# Patient Record
Sex: Female | Born: 1960 | Race: White | Hispanic: No | Marital: Single | Smoking: Never smoker
Health system: Southern US, Community
[De-identification: ages and names within clinical notes are randomized; demographics above are authoritative.]

## PROBLEM LIST (undated history)

## (undated) DIAGNOSIS — E785 Hyperlipidemia, unspecified: Secondary | ICD-10-CM

## (undated) DIAGNOSIS — F4312 Post-traumatic stress disorder, chronic: Secondary | ICD-10-CM

## (undated) DIAGNOSIS — R55 Syncope and collapse: Secondary | ICD-10-CM

## (undated) DIAGNOSIS — I10 Essential (primary) hypertension: Secondary | ICD-10-CM

## (undated) DIAGNOSIS — IMO0002 Reserved for concepts with insufficient information to code with codable children: Secondary | ICD-10-CM

## (undated) DIAGNOSIS — G43819 Other migraine, intractable, without status migrainosus: Secondary | ICD-10-CM

## (undated) DIAGNOSIS — M329 Systemic lupus erythematosus, unspecified: Secondary | ICD-10-CM

## (undated) DIAGNOSIS — E119 Type 2 diabetes mellitus without complications: Secondary | ICD-10-CM

## (undated) HISTORY — PX: FOOT SURGERY: SHX648

## (undated) HISTORY — DX: Hyperlipidemia, unspecified: E78.5

## (undated) HISTORY — DX: Other migraine, intractable, without status migrainosus: G43.819

## (undated) HISTORY — PX: STOMACH SURGERY: SHX791

## (undated) HISTORY — PX: ABDOMINAL SURGERY: SHX537

## (undated) HISTORY — DX: Syncope and collapse: R55

## (undated) HISTORY — DX: Post-traumatic stress disorder, chronic: F43.12

## (undated) HISTORY — PX: SPLENECTOMY: SUR1306

---

## 1996-04-23 HISTORY — PX: ORTHOPEDIC SURGERY: SHX850

## 2005-04-23 HISTORY — PX: CERVICAL ABLATION: SHX5771

## 2010-04-23 HISTORY — PX: ROTATOR CUFF REPAIR: SHX139

## 2018-04-24 DIAGNOSIS — D509 Iron deficiency anemia, unspecified: Secondary | ICD-10-CM | POA: Insufficient documentation

## 2018-04-24 HISTORY — DX: Iron deficiency anemia, unspecified: D50.9

## 2018-05-27 DIAGNOSIS — D508 Other iron deficiency anemias: Secondary | ICD-10-CM | POA: Diagnosis not present

## 2018-05-27 DIAGNOSIS — K912 Postsurgical malabsorption, not elsewhere classified: Secondary | ICD-10-CM

## 2018-07-23 DIAGNOSIS — M359 Systemic involvement of connective tissue, unspecified: Secondary | ICD-10-CM

## 2018-07-23 HISTORY — DX: Systemic involvement of connective tissue, unspecified: M35.9

## 2018-08-25 DIAGNOSIS — D508 Other iron deficiency anemias: Secondary | ICD-10-CM | POA: Diagnosis not present

## 2018-08-25 DIAGNOSIS — K912 Postsurgical malabsorption, not elsewhere classified: Secondary | ICD-10-CM

## 2018-09-16 DIAGNOSIS — K912 Postsurgical malabsorption, not elsewhere classified: Secondary | ICD-10-CM

## 2018-09-16 DIAGNOSIS — D508 Other iron deficiency anemias: Secondary | ICD-10-CM

## 2019-01-01 DIAGNOSIS — G5601 Carpal tunnel syndrome, right upper limb: Secondary | ICD-10-CM | POA: Insufficient documentation

## 2019-01-01 DIAGNOSIS — M6530 Trigger finger, unspecified finger: Secondary | ICD-10-CM | POA: Insufficient documentation

## 2019-04-02 DIAGNOSIS — D509 Iron deficiency anemia, unspecified: Secondary | ICD-10-CM

## 2019-04-20 DIAGNOSIS — K279 Peptic ulcer, site unspecified, unspecified as acute or chronic, without hemorrhage or perforation: Secondary | ICD-10-CM

## 2019-04-20 DIAGNOSIS — K2270 Barrett's esophagus without dysplasia: Secondary | ICD-10-CM

## 2019-04-20 HISTORY — DX: Barrett's esophagus without dysplasia: K22.70

## 2019-04-20 HISTORY — DX: Peptic ulcer, site unspecified, unspecified as acute or chronic, without hemorrhage or perforation: K27.9

## 2019-05-08 DIAGNOSIS — M25511 Pain in right shoulder: Secondary | ICD-10-CM | POA: Insufficient documentation

## 2019-08-22 HISTORY — PX: HAND SURGERY: SHX662

## 2019-10-09 DIAGNOSIS — D509 Iron deficiency anemia, unspecified: Secondary | ICD-10-CM

## 2019-11-10 DIAGNOSIS — M7751 Other enthesopathy of right foot: Secondary | ICD-10-CM | POA: Insufficient documentation

## 2019-11-10 DIAGNOSIS — G5761 Lesion of plantar nerve, right lower limb: Secondary | ICD-10-CM

## 2019-11-10 DIAGNOSIS — M2041 Other hammer toe(s) (acquired), right foot: Secondary | ICD-10-CM

## 2019-11-10 HISTORY — DX: Other hammer toe(s) (acquired), right foot: M20.41

## 2019-11-10 HISTORY — DX: Other enthesopathy of right foot and ankle: M77.51

## 2019-11-10 HISTORY — DX: Lesion of plantar nerve, right lower limb: G57.61

## 2019-11-16 ENCOUNTER — Inpatient Hospital Stay (HOSPITAL_COMMUNITY)
Admission: AD | Admit: 2019-11-16 | Discharge: 2019-11-19 | DRG: 885 | Disposition: A | Discharge status: Home / Self Care | Payer: Medicaid Other | Source: Intra-hospital | Attending: Psychiatry | Admitting: Psychiatry

## 2019-11-16 DIAGNOSIS — F332 Major depressive disorder, recurrent severe without psychotic features: Principal | ICD-10-CM | POA: Diagnosis present

## 2019-11-16 DIAGNOSIS — I10 Essential (primary) hypertension: Secondary | ICD-10-CM | POA: Diagnosis present

## 2019-11-16 DIAGNOSIS — M797 Fibromyalgia: Secondary | ICD-10-CM | POA: Diagnosis present

## 2019-11-16 DIAGNOSIS — F4310 Post-traumatic stress disorder, unspecified: Secondary | ICD-10-CM | POA: Diagnosis present

## 2019-11-16 DIAGNOSIS — M329 Systemic lupus erythematosus, unspecified: Secondary | ICD-10-CM | POA: Diagnosis present

## 2019-11-16 DIAGNOSIS — Z79899 Other long term (current) drug therapy: Secondary | ICD-10-CM

## 2019-11-16 DIAGNOSIS — Z91410 Personal history of adult physical and sexual abuse: Secondary | ICD-10-CM

## 2019-11-16 DIAGNOSIS — Z560 Unemployment, unspecified: Secondary | ICD-10-CM | POA: Diagnosis not present

## 2019-11-16 DIAGNOSIS — Z885 Allergy status to narcotic agent status: Secondary | ICD-10-CM

## 2019-11-16 DIAGNOSIS — Z888 Allergy status to other drugs, medicaments and biological substances status: Secondary | ICD-10-CM

## 2019-11-16 DIAGNOSIS — Z882 Allergy status to sulfonamides status: Secondary | ICD-10-CM | POA: Diagnosis not present

## 2019-11-16 DIAGNOSIS — Z880 Allergy status to penicillin: Secondary | ICD-10-CM

## 2019-11-16 DIAGNOSIS — Z87892 Personal history of anaphylaxis: Secondary | ICD-10-CM

## 2019-11-16 DIAGNOSIS — Z915 Personal history of self-harm: Secondary | ICD-10-CM | POA: Diagnosis not present

## 2019-11-16 HISTORY — DX: Essential (primary) hypertension: I10

## 2019-11-16 HISTORY — DX: Systemic lupus erythematosus, unspecified: M32.9

## 2019-11-16 HISTORY — DX: Reserved for concepts with insufficient information to code with codable children: IMO0002

## 2019-11-16 HISTORY — DX: Major depressive disorder, recurrent severe without psychotic features: F33.2

## 2019-11-16 MED ORDER — LOSARTAN POTASSIUM 25 MG PO TABS
25.0000 mg | ORAL_TABLET | Freq: Every day | ORAL | Status: DC
  Administered 2019-11-17 – 2019-11-19 (×3): 25 mg via ORAL
  Filled 2019-11-16 (×5): qty 1

## 2019-11-16 MED ORDER — TOPIRAMATE 100 MG PO TABS
100.0000 mg | ORAL_TABLET | Freq: Every day | ORAL | Status: DC
  Administered 2019-11-17 – 2019-11-19 (×3): 100 mg via ORAL
  Filled 2019-11-16 (×7): qty 1

## 2019-11-16 MED ORDER — PANTOPRAZOLE SODIUM 40 MG PO TBEC
40.0000 mg | DELAYED_RELEASE_TABLET | Freq: Every day | ORAL | Status: DC
  Administered 2019-11-17 – 2019-11-19 (×3): 40 mg via ORAL
  Filled 2019-11-16 (×5): qty 1

## 2019-11-16 MED ORDER — MONTELUKAST SODIUM 10 MG PO TABS
10.0000 mg | ORAL_TABLET | Freq: Every day | ORAL | Status: DC
  Administered 2019-11-17 – 2019-11-18 (×3): 10 mg via ORAL
  Filled 2019-11-16 (×6): qty 1

## 2019-11-16 MED ORDER — FLUTICASONE PROPIONATE 50 MCG/ACT NA SUSP
1.0000 | Freq: Every day | NASAL | Status: DC
  Administered 2019-11-17 – 2019-11-19 (×3): 1 via NASAL
  Filled 2019-11-16: qty 16

## 2019-11-16 MED ORDER — CITALOPRAM HYDROBROMIDE 10 MG PO TABS
10.0000 mg | ORAL_TABLET | Freq: Every day | ORAL | Status: DC
  Administered 2019-11-17 – 2019-11-19 (×3): 10 mg via ORAL
  Filled 2019-11-16 (×5): qty 1

## 2019-11-16 MED ORDER — TRIAMTERENE-HCTZ 37.5-25 MG PO TABS
1.0000 | ORAL_TABLET | Freq: Every day | ORAL | Status: DC
  Administered 2019-11-17 – 2019-11-19 (×3): 1 via ORAL
  Filled 2019-11-16 (×5): qty 1

## 2019-11-16 MED ORDER — SUCRALFATE 1 GM/10ML PO SUSP
1.0000 g | Freq: Three times a day (TID) | ORAL | Status: DC | PRN
  Administered 2019-11-19: 1 g via ORAL
  Filled 2019-11-16: qty 10

## 2019-11-16 MED ORDER — ACETAMINOPHEN 325 MG PO TABS
650.0000 mg | ORAL_TABLET | Freq: Four times a day (QID) | ORAL | Status: DC | PRN
  Administered 2019-11-17 – 2019-11-18 (×2): 650 mg via ORAL
  Filled 2019-11-16 (×2): qty 2

## 2019-11-16 MED ORDER — MAGNESIUM HYDROXIDE 400 MG/5ML PO SUSP: 30.0000 mL | Freq: Every day | ORAL | Status: DC | PRN

## 2019-11-16 MED ORDER — TIZANIDINE HCL 2 MG PO TABS
2.0000 mg | ORAL_TABLET | Freq: Three times a day (TID) | ORAL | Status: DC | PRN
  Administered 2019-11-17 – 2019-11-18 (×3): 2 mg via ORAL
  Filled 2019-11-16 (×3): qty 1

## 2019-11-16 MED ORDER — FOLIC ACID 1 MG PO TABS
1.0000 mg | ORAL_TABLET | Freq: Every day | ORAL | Status: DC
  Administered 2019-11-17 – 2019-11-19 (×3): 1 mg via ORAL
  Filled 2019-11-16 (×5): qty 1

## 2019-11-16 MED ORDER — ALUM & MAG HYDROXIDE-SIMETH 200-200-20 MG/5ML PO SUSP: 30.0000 mL | ORAL | Status: DC | PRN

## 2019-11-16 MED ORDER — HYDROXYCHLOROQUINE SULFATE 200 MG PO TABS
200.0000 mg | ORAL_TABLET | Freq: Two times a day (BID) | ORAL | Status: DC
  Administered 2019-11-17 – 2019-11-19 (×5): 200 mg via ORAL
  Filled 2019-11-16 (×9): qty 1

## 2019-11-17 ENCOUNTER — Other Ambulatory Visit: Payer: Self-pay

## 2019-11-17 ENCOUNTER — Encounter (HOSPITAL_COMMUNITY): Payer: Self-pay | Admitting: Psychiatry

## 2019-11-17 DIAGNOSIS — F332 Major depressive disorder, recurrent severe without psychotic features: Principal | ICD-10-CM

## 2019-11-17 LAB — COMPREHENSIVE METABOLIC PANEL
ALT: 18 U/L (ref 0–44)
AST: 22 U/L (ref 15–41)
Albumin: 3.9 g/dL (ref 3.5–5.0)
Alkaline Phosphatase: 86 U/L (ref 38–126)
Anion gap: 13 (ref 5–15)
BUN: 16 mg/dL (ref 6–20)
CO2: 21 mmol/L — ABNORMAL LOW (ref 22–32)
Calcium: 9.7 mg/dL (ref 8.9–10.3)
Chloride: 108 mmol/L (ref 98–111)
Creatinine, Ser: 0.86 mg/dL (ref 0.44–1.00)
GFR calc Af Amer: >60 mL/min (ref >60)
GFR calc non Af Amer: >60 mL/min (ref >60)
Glucose, Bld: 96 mg/dL (ref 70–99)
Potassium: 4.3 mmol/L (ref 3.5–5.1)
Sodium: 142 mmol/L (ref 135–145)
Total Bilirubin: 0.7 mg/dL (ref 0.3–1.2)
Total Protein: 6.7 g/dL (ref 6.5–8.1)

## 2019-11-17 LAB — LIPID PANEL
Cholesterol: 223 mg/dL — ABNORMAL HIGH (ref 0–200)
HDL: 89 mg/dL (ref >40)
LDL Cholesterol: 104 mg/dL — ABNORMAL HIGH (ref 0–99)
Total CHOL/HDL Ratio: 2.5 RATIO
Triglycerides: 148 mg/dL (ref <150)
VLDL: 30 mg/dL (ref 0–40)

## 2019-11-17 LAB — CBC
HCT: 43.4 % (ref 36.0–46.0)
Hemoglobin: 14 g/dL (ref 12.0–15.0)
MCH: 28.9 pg (ref 26.0–34.0)
MCHC: 32.3 g/dL (ref 30.0–36.0)
MCV: 89.7 fL (ref 80.0–100.0)
Platelets: 456 10*3/uL — ABNORMAL HIGH (ref 150–400)
RBC: 4.84 MIL/uL (ref 3.87–5.11)
RDW: 18.6 % — ABNORMAL HIGH (ref 11.5–15.5)
WBC: 6.6 10*3/uL (ref 4.0–10.5)
nRBC: 0 % (ref 0.0–0.2)

## 2019-11-17 LAB — HEMOGLOBIN A1C
Hgb A1c MFr Bld: 5.9 % — ABNORMAL HIGH (ref 4.8–5.6)
Mean Plasma Glucose: 122.63 mg/dL

## 2019-11-17 LAB — TSH: TSH: 2.632 u[IU]/mL (ref 0.350–4.500)

## 2019-11-17 MED ORDER — LORAZEPAM 1 MG PO TABS: 1.0000 mg | ORAL_TABLET | Freq: Four times a day (QID) | ORAL | Status: DC | PRN

## 2019-11-17 MED ORDER — LOPERAMIDE HCL 2 MG PO CAPS: 2.0000 mg | ORAL_CAPSULE | ORAL | Status: DC | PRN

## 2019-11-17 MED ORDER — ADULT MULTIVITAMIN W/MINERALS CH
1.0000 | ORAL_TABLET | Freq: Every day | ORAL | Status: DC
  Administered 2019-11-17 – 2019-11-19 (×3): 1 via ORAL
  Filled 2019-11-17 (×7): qty 1

## 2019-11-17 MED ORDER — ONDANSETRON 4 MG PO TBDP: 4.0000 mg | ORAL_TABLET | Freq: Four times a day (QID) | ORAL | Status: DC | PRN

## 2019-11-17 MED ORDER — GABAPENTIN 100 MG PO CAPS
100.0000 mg | ORAL_CAPSULE | Freq: Three times a day (TID) | ORAL | Status: DC
  Administered 2019-11-17 – 2019-11-19 (×6): 100 mg via ORAL
  Filled 2019-11-17 (×12): qty 1

## 2019-11-17 MED ORDER — THIAMINE HCL 100 MG PO TABS
100.0000 mg | ORAL_TABLET | Freq: Every day | ORAL | Status: DC
  Administered 2019-11-18 – 2019-11-19 (×2): 100 mg via ORAL
  Filled 2019-11-17 (×4): qty 1

## 2019-11-17 MED ORDER — HYDROXYZINE HCL 25 MG PO TABS: 25.0000 mg | ORAL_TABLET | Freq: Four times a day (QID) | ORAL | Status: DC | PRN

## 2019-11-17 NOTE — Progress Notes (Addendum)
D:  Patient's self inventory sheet, patient has fair sleep, no medications given.  Poor appetite, low energy level, poor concentration.  Rated depression and hopeless #8.  Rated anxiety #5.  Denied withdrawals.  Denied SI.  Denied physical problems, then checked lightheaded and headaches, blurred vision.  Physical pain, worst pain #7, hips, knees, feet, shoulders.  Pain medicine helpful.  Goal is discharge.  Does have discharge plans. A:  Medications administered per MD orders.  Emotional support and encouragement given patient. R:  Denied SI and HI, contracts for safety.  Denied A/V hallucinations.  Safety maintained with 15 minute checks.
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
## 2019-11-17 NOTE — Progress Notes (Signed)
Psychoeducational Group Note  Date:  11/17/2019 Time:  2139  Group Topic/Focus:  Wrap-Up Group:   The focus of this group is to help patients review their daily goal of treatment and discuss progress on daily workbooks.  Participation Level: Did Not Attend  Participation Quality:  Not Applicable  Affect:  Not Applicable  Cognitive:  Not Applicable  Insight:  Not Applicable  Engagement in Group: Not Applicable  Additional Comments:  The patient did not attend group this evening.   Hazle Coca S 11/17/2019, 9:39 PM

## 2019-11-17 NOTE — Progress Notes (Signed)
The patient learned today that she does not need to get upset by her current situation and should talk to others. Her goal for tomorrow is to find her next step in preparing for discharge.
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
## 2019-11-17 NOTE — Plan of Care (Signed)
Nurse discussed anxiety, depression and coping skills with patient.  

## 2019-11-17 NOTE — Progress Notes (Signed)
Pt stated she was having HA and pt could only have Tizanidine at the time, pt may think it may be due to a lack of Caffeine.     11/17/19 2300  Psych Admission Type (Psych Patients Only)  Admission Status Voluntary  Psychosocial Assessment  Patient Complaints Depression  Eye Contact Fair  Facial Expression Worried;Sad  Affect Anxious;Sad  Speech Logical/coherent  Interaction Other (Comment) (appropriate)  Motor Activity Slow  Appearance/Hygiene Disheveled  Behavior Characteristics Cooperative  Mood Pleasant;Sad  Thought Process  Coherency WDL  Content WDL  Delusions None reported or observed  Perception WDL  Hallucination None reported or observed  Judgment Poor  Confusion None  Danger to Self  Current suicidal ideation? Denies  Danger to Others  Danger to Others None reported or observed

## 2019-11-17 NOTE — Progress Notes (Signed)
Patient ID: Maria Pittman, female   DOB: 1960-12-19, 59 y.o.   MRN: [id number]  Admission Note:  D:59 yr female who presents VC in no acute distress for the treatment of SI and Depression. Pt appears flat and depressed. Pt was calm and cooperative with admission process. Pt presents with passive SI and contracts for safety upon admission. Pt denies AVH . Pt stated she and her husband moved from [place] 1.5 yrs ago and 2 daughters( early 43's) made her feel like she abandoned them in [place] . Pt stated she felt like she has no family support since they moved to [place]. Pt stated she felt SI for past 2 months. Per paperwork from [facility] pt took unknown amount of gabapentin, Phenergan, and ETOH  In an attempt to kill herself . Pt stated there was no point in living anymore, pt unsure who called EMS. Pt has Hx iron deficiency anemia and Lupus.   A:Skin was assessed Maria Pittman) and found to be clear of any abnormal marks apart from surgical scars Midline abdomen , L-side. PT searched and no contraband found, POC and unit policies explained and understanding verbalized. Consents obtained. Food and fluids offered, and fluids accepted.   R: Pt had no additional questions or concerns.

## 2019-11-17 NOTE — Tx Team (Signed)
Initial Treatment Plan 11/17/2019 1:09 AM Nolon Nations SFS:[id number]    PATIENT STRESSORS: Health problems Marital or family conflict Medication change or noncompliance   PATIENT STRENGTHS: General fund of knowledge Motivation for treatment/growth   PATIENT IDENTIFIED PROBLEMS: Risk for suicide  depression  "I have to get my 2nd COVID vaccine shot, it was due on the 21 st"                 DISCHARGE CRITERIA:  Improved stabilization in mood, thinking, and/or behavior Verbal commitment to aftercare and medication compliance  PRELIMINARY DISCHARGE PLAN: Attend PHP/IOP Outpatient therapy  PATIENT/FAMILY INVOLVEMENT: This treatment plan has been presented to and reviewed with the patient, Maria Pittman.  The patient and family have been given the opportunity to ask questions and make suggestions.  Delos Haring, RN 11/17/2019, 1:09 AM

## 2019-11-18 DIAGNOSIS — F332 Major depressive disorder, recurrent severe without psychotic features: Secondary | ICD-10-CM | POA: Diagnosis not present

## 2019-11-18 NOTE — Plan of Care (Signed)
  Problem: Education: Goal: Emotional status will improve Outcome: Progressing Goal: Mental status will improve Outcome: Progressing   Problem: Coping: Goal: Ability to verbalize frustrations and anger appropriately will improve Outcome: Progressing   

## 2019-11-18 NOTE — Progress Notes (Signed)
Pt stated she was doing better    11/18/19 2300  Psych Admission Type (Psych Patients Only)  Admission Status Voluntary  Psychosocial Assessment  Patient Complaints None  Eye Contact Fair  Facial Expression Worried;Sad  Affect Anxious;Sad  Speech Logical/coherent  Interaction Other (Comment) (appropriate)  Motor Activity Slow  Appearance/Hygiene Disheveled  Behavior Characteristics Cooperative  Mood Anxious  Thought Process  Coherency WDL  Content WDL  Delusions None reported or observed  Perception WDL  Hallucination None reported or observed  Judgment Poor  Confusion None  Danger to Self  Current suicidal ideation? Denies  Danger to Others  Danger to Others None reported or observed
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
## 2019-11-18 NOTE — Progress Notes (Signed)
[place] NOVEL CORONAVIRUS (COVID-19) DAILY CHECK-OFF SYMPTOMS - answer yes or no to each - every day NO YES  Have you had a fever in the past 24 hours?  . Fever (Temp > 37.80C / 100F) X   Have you had any of these symptoms in the past 24 hours? . New Cough .  Sore Throat  .  Shortness of Breath .  Difficulty Breathing .  Unexplained Body Aches   X   Have you had any one of these symptoms in the past 24 hours not related to allergies?   . Runny Nose .  Nasal Congestion .  Sneezing   X   If you have had runny nose, nasal congestion, sneezing in the past 24 hours, has it worsened?  X   EXPOSURES - check yes or no X   Have you traveled outside the state in the past 14 days?  X   Have you been in contact with someone with a confirmed diagnosis of COVID-19 or PUI in the past 14 days without wearing appropriate PPE?  X   Have you been living in the same home as a person with confirmed diagnosis of COVID-19 or a PUI (household contact)?    X   Have you been diagnosed with COVID-19?    X              What to do next: Answered NO to all: Answered YES to anything:   Proceed with unit schedule Follow the [facility] Inpatient Flowsheet.   

## 2019-11-18 NOTE — Progress Notes (Signed)
D. Pt presents as anxious- is friendly upon approach- pt continues to complain of HA this am, 7/10, endorsing low energy and poor concentration. Per pt's self inventory, pt rated her depression, hopelessness and anxiety a 1/1/0, respectively. Pt wrote that her goal today was "going home".Pt currently denies SI/HI and AVH  A. Labs and vitals monitored. Scheduled meds administered and prn med given for pain.  Pt supported emotionally and encouraged to express concerns and ask questions.   R. Pt remains safe with 15 minute checks. Will continue POC.

## 2019-11-18 NOTE — Progress Notes (Addendum)
[facility] MD Progress Note  11/18/2019 4:30 PM Maria Pittman  MRN:  [id number] Subjective: patient reports she is feeling " a lot better". Denies suicidal ideations.  Denies medication side effects. Objective : I have discussed case with treatment team and have met with patient.  59 year old married female, presented following an overdose on phenergan, gabapentin and alcohol. The overdose was unplanned, impulsive, and in the context of feeling acutely upset after a phone conversation with her adult daughter, whom she states is critical of her and accuses her of being a bad mother. She also reports she moved from [place] to [place] a few months ago and has a limited social and support network locally. She did endorse depression and neuro-vegetative symptoms of depression but denied having suicidal ideations leading up to admission. Medical history remarkable for Lupus for which she has been on plaquenil for years .  Today feeling better, states her mood is better. Denies suicidal ideations. States she feels her daughter is angry with her for having moved away ( moved from [place] to [place] earlier this year) and " that is really the reason she is giving me a hard time". Denies medication side effects- she was continued on her home medications- Celexa 10 mgrs QDAY , Neurontin 100 mgrs TID, Topamax 100 mgrs QDAY. She was also continued on Plaquenil.  She is not presenting with symptoms of alcohol WDL. No tremors, no diaphoresis, no restlessness or agitation, BP 149/78, pulse 93.  Behavior on unit is calm and in good control.  Principal Problem:  Diagnosis: Active Problems:   Severe recurrent major depression without psychotic features ([place])  Total Time spent with patient: 20 minutes  Past Psychiatric History:  Past Medical History:  Past Medical History:  Diagnosis Date  . Hypertension   . Lupus ([place])    History reviewed. No pertinent surgical history. Family History: History reviewed. No pertinent family  history. Family Psychiatric  History:  Social History:  Social History   Substance and Sexual Activity  Alcohol Use Yes     Social History   Substance and Sexual Activity  Drug Use Never    Social History   Socioeconomic History  . Marital status: Single    Spouse name: Not on file  . Number of children: Not on file  . Years of education: Not on file  . Highest education level: Not on file  Occupational History  . Not on file  Tobacco Use  . Smoking status: Never Smoker  . Smokeless tobacco: Never Used  Vaping Use  . Vaping Use: Never used  Substance and Sexual Activity  . Alcohol use: Yes  . Drug use: Never  . Sexual activity: Not Currently  Other Topics Concern  . Not on file  Social History Narrative  . Not on file   Social Determinants of Health   Financial Resource Strain:   . Difficulty of Paying Living Expenses:   Food Insecurity:   . Worried About Charity fundraiser in the Last Year:   . Arboriculturist in the Last Year:   Transportation Needs:   . Film/video editor (Medical):   Marland Kitchen Lack of Transportation (Non-Medical):   Physical Activity:   . Days of Exercise per Week:   . Minutes of Exercise per Session:   Stress:   . Feeling of Stress :   Social Connections:   . Frequency of Communication with Friends and Family:   . Frequency of Social Gatherings with Friends  and Family:   . Attends Religious Services:   . Active Member of Clubs or Organizations:   . Attends Archivist Meetings:   Marland Kitchen Marital Status:    Additional Social History:   Sleep: Good  Appetite:  Good  Current Medications: Current Facility-Administered Medications  Medication Dose Route Frequency Provider Last Rate Last Admin  . acetaminophen (TYLENOL) tablet 650 mg  650 mg Oral Q6H PRN Lindon Romp A, NP   650 mg at 11/18/19 0818  . alum & mag hydroxide-simeth (MAALOX/MYLANTA) 200-200-20 MG/5ML suspension 30 mL  30 mL Oral Q4H PRN Lindon Romp A, NP      .  citalopram (CELEXA) tablet 10 mg  10 mg Oral Daily Lindon Romp A, NP   10 mg at 11/18/19 0814  . fluticasone (FLONASE) 50 MCG/ACT nasal spray 1 spray  1 spray Each Nare Daily Lindon Romp A, NP   1 spray at 11/18/19 0814  . folic acid (FOLVITE) tablet 1 mg  1 mg Oral Daily Lindon Romp A, NP   1 mg at 11/18/19 0814  . gabapentin (NEURONTIN) capsule 100 mg  100 mg Oral TID Merton Wadlow, Myer Peer, MD   100 mg at 11/18/19 1238  . hydroxychloroquine (PLAQUENIL) tablet 200 mg  200 mg Oral BID Lindon Romp A, NP   200 mg at 11/18/19 7116  . hydrOXYzine (ATARAX/VISTARIL) tablet 25 mg  25 mg Oral Q6H PRN Connye Burkitt, NP      . loperamide (IMODIUM) capsule 2-4 mg  2-4 mg Oral PRN Connye Burkitt, NP      . LORazepam (ATIVAN) tablet 1 mg  1 mg Oral Q6H PRN Connye Burkitt, NP      . losartan (COZAAR) tablet 25 mg  25 mg Oral Daily Lindon Romp A, NP   25 mg at 11/18/19 0814  . magnesium hydroxide (MILK OF MAGNESIA) suspension 30 mL  30 mL Oral Daily PRN Lindon Romp A, NP      . montelukast (SINGULAIR) tablet 10 mg  10 mg Oral QHS Lindon Romp A, NP   10 mg at 11/17/19 2053  . multivitamin with minerals tablet 1 tablet  1 tablet Oral Daily Connye Burkitt, NP   1 tablet at 11/18/19 0818  . pantoprazole (PROTONIX) EC tablet 40 mg  40 mg Oral Daily Lindon Romp A, NP   40 mg at 11/18/19 0814  . sucralfate (CARAFATE) 1 GM/10ML suspension 1 g  1 g Oral TID PRN Lindon Romp A, NP      . thiamine tablet 100 mg  100 mg Oral Daily Connye Burkitt, NP   100 mg at 11/18/19 0814  . tiZANidine (ZANAFLEX) tablet 2 mg  2 mg Oral Q8H PRN Lindon Romp A, NP   2 mg at 11/17/19 2053  . topiramate (TOPAMAX) tablet 100 mg  100 mg Oral Daily Lindon Romp A, NP   100 mg at 11/18/19 0814  . triamterene-hydrochlorothiazide (MAXZIDE-25) 37.5-25 MG per tablet 1 tablet  1 tablet Oral Daily Lindon Romp A, NP   1 tablet at 11/18/19 5790    Lab Results:  Results for orders placed or performed during the hospital encounter of 11/16/19  (from the past 48 hour(s))  CBC     Status: Abnormal   Collection Time: 11/17/19  6:22 AM  Result Value Ref Range   WBC 6.6 4.0 - 10.5 K/uL   RBC 4.84 3.87 - 5.11 MIL/uL   Hemoglobin 14.0 12.0 - 15.0 g/dL  HCT 43.4 36 - 46 %   MCV 89.7 80.0 - 100.0 fL   MCH 28.9 26.0 - 34.0 pg   MCHC 32.3 30.0 - 36.0 g/dL   RDW 18.6 (H) 11.5 - 15.5 %   Platelets 456 (H) 150 - 400 K/uL   nRBC 0.0 0.0 - 0.2 %    Comment: Performed at [facility], [place] [address] [place], [place] [address]  Comprehensive metabolic panel     Status: Abnormal   Collection Time: 11/17/19  6:22 AM  Result Value Ref Range   Sodium 142 135 - 145 mmol/L   Potassium 4.3 3.5 - 5.1 mmol/L   Chloride 108 98 - 111 mmol/L   CO2 21 (L) 22 - 32 mmol/L   Glucose, Bld 96 70 - 99 mg/dL    Comment: Glucose reference range applies only to samples taken after fasting for at least 8 hours.   BUN 16 6 - 20 mg/dL   Creatinine, Ser 0.86 0.44 - 1.00 mg/dL   Calcium 9.7 8.9 - 10.3 mg/dL   Total Protein 6.7 6.5 - 8.1 g/dL   Albumin 3.9 3.5 - 5.0 g/dL   AST 22 15 - 41 U/L   ALT 18 0 - 44 U/L   Alkaline Phosphatase 86 38 - 126 U/L   Total Bilirubin 0.7 0.3 - 1.2 mg/dL   GFR calc non Af Amer >60 >60 mL/min   GFR calc Af Amer >60 >60 mL/min   Anion gap 13 5 - 15    Comment: Performed at [facility], [place] [address], [place], [place] [address]  Hemoglobin A1c     Status: Abnormal   Collection Time: 11/17/19  6:22 AM  Result Value Ref Range   Hgb A1c MFr Bld 5.9 (H) 4.8 - 5.6 %    Comment: (NOTE) Pre diabetes:          5.7%-6.4%  Diabetes:              >6.4%  Glycemic control for   <7.0% adults with diabetes    Mean Plasma Glucose 122.63 mg/dL    Comment: Performed at [facility] [address] [place], [place] [address]  Lipid panel     Status: Abnormal   Collection Time: 11/17/19  6:22 AM  Result Value Ref Range   Cholesterol 223 (H) 0 - 200 mg/dL   Triglycerides 148 <150 mg/dL    HDL 89 >40 mg/dL   Total CHOL/HDL Ratio 2.5 RATIO   VLDL 30 0 - 40 mg/dL   LDL Cholesterol 104 (H) 0 - 99 mg/dL    Comment:        Total Cholesterol/HDL:CHD Risk Coronary Heart Disease Risk Table                     Men   Women  1/2 Average Risk   3.4   3.3  Average Risk       5.0   4.4  2 X Average Risk   9.6   7.1  3 X Average Risk  23.4   11.0        Use the calculated Patient Ratio above and the CHD Risk Table to determine the patient's CHD Risk.        ATP III CLASSIFICATION (LDL):  <100     mg/dL   Optimal  100-129  mg/dL   Near or Above  Optimal  130-159  mg/dL   Borderline  160-189  mg/dL   High  >190     mg/dL   Very High Performed at [facility] [address] [place], [place] [address]   TSH     Status: None   Collection Time: 11/17/19  6:22 AM  Result Value Ref Range   TSH 2.632 0.350 - 4.500 uIU/mL    Comment: Performed by a 3rd Generation assay with a functional sensitivity of <=0.01 uIU/mL. Performed at [facility], [place] [address] [place], [place] [address]     Blood Alcohol level:  No results found for: [facility]  Metabolic Disorder Labs: Lab Results  Component Value Date   HGBA1C 5.9 (H) 11/17/2019   MPG 122.63 11/17/2019   No results found for: PROLACTIN Lab Results  Component Value Date   CHOL 223 (H) 11/17/2019   TRIG 148 11/17/2019   HDL 89 11/17/2019   CHOLHDL 2.5 11/17/2019   VLDL 30 11/17/2019   LDLCALC 104 (H) 11/17/2019    Physical Findings: AIMS:  , ,  ,  ,    CIWA:  CIWA-Ar Total: 0 COWS:     Musculoskeletal: Strength & Muscle Tone: within normal limits No tremors, no diaphoresis , no restlessness or psychomotor agitation Gait & Station: normal Patient leans: N/A  Psychiatric Specialty Exam: Physical Exam  Review of Systems reports headache which she reports is likely related to caffeine WDL as she drinks coffee at home, no chest pain, no shortness of breath, no  vomiting   Blood pressure (!) 149/78, pulse 93, temperature 98.1 F (36.7 C), temperature source Oral, resp. rate 18, height 5' (1.524 m), weight 63 kg, SpO2 99 %.Body mass index is 27.15 kg/m.  General Appearance: improved grooming   Eye Contact:  Good  Speech:  Normal Rate  Volume:  Normal  Mood:  improved, states she is feeling better  Affect:  more reactive, fuller in range   Thought Process:  Linear and Descriptions of Associations: Intact  Orientation:  Full (Time, Place, and Person)  Thought Content:  denies hallucinations, no delusions, not internally preoccupied  Suicidal Thoughts:  No denies suicidal or self injurious ideations  Homicidal Thoughts:  No  Memory:  recent and remote grossly intact   Judgement:  Other:  improving   Insight:  fair- improving   Psychomotor Activity:  Normal no restlessness or agitation  Concentration:  Concentration: Good and Attention Span: Good  Recall:  Good  Fund of Knowledge:  Good  Language:  Good  Akathisia:  Negative  Handed:  Right  AIMS (if indicated):     Assets:  Communication Skills Desire for Improvement Resilience  ADL's:  Intact  Cognition:  WNL  Sleep:  Number of Hours: 6.75   Assessment- 59 year old married female, presented following an overdose on phenergan, gabapentin and alcohol. The overdose was unplanned, impulsive, and in the context of feeling acutely upset after a phone conversation with her adult daughter, whom she states is critical of her and accuses her of being a bad mother. She also reports she moved from [place] to [place] a few months ago and has a limited social and support network locally. She did endorse depression and neuro-vegetative symptoms of depression but denied having suicidal ideations leading up to admission. Medical history remarkable for Lupus for which she has been on plaquenil for years .  Patient is presenting with improving mood and range of affect . She denies SI today  and presents future  oriented. Denies medication side effects.    Treatment Plan Summary: Daily contact with patient to assess and evaluate symptoms and progress in treatment, Medication management, Plan inpatient treatment and medications as below Encourage group and milieu participation Treatment team working on disposition planning  Continue Celexa 10 mgrs QDAY for depression Continue Neurontin 100 mgrs TID for anxiety, pain Continue Topamax 100 mgrs QDAY for migraine prophylaxis Continue Ativan 1 mgr Q 6 hours PRN alcohol WDL symptoms if needed  Continue Plaquenil for history of Lupus  Continue Cozaar 25 mgrs QDAY and Triamterene /HCTZ 37.5/25 mg for HTN Continue Protonix and Carafate for GERD , gastric protection Check EKG to monitor QTc ( EKG QTc 473 at [facility] ED on 7/25)     Jenne Campus, MD 11/18/2019, 4:30 PM

## 2019-11-18 NOTE — Progress Notes (Signed)
EKG results placed on the outside of patient's shadow chart  Normal sinus rhythm Normal ECG  QT/QTc  382/464 ms

## 2019-11-18 NOTE — Tx Team (Cosign Needed)
Interdisciplinary Treatment and Diagnostic Plan Update  11/18/2019 Time of Session: 5409W Maria Pittman MRN: [id number]  Principal Diagnosis: <principal problem not specified>  Secondary Diagnoses: Active Problems:   Severe recurrent major depression without psychotic features ([place])   Current Medications:  Current Facility-Administered Medications  Medication Dose Route Frequency Provider Last Rate Last Admin  . acetaminophen (TYLENOL) tablet 650 mg  650 mg Oral Q6H PRN Lindon Romp A, NP   650 mg at 11/18/19 0818  . alum & mag hydroxide-simeth (MAALOX/MYLANTA) 200-200-20 MG/5ML suspension 30 mL  30 mL Oral Q4H PRN Lindon Romp A, NP      . citalopram (CELEXA) tablet 10 mg  10 mg Oral Daily Lindon Romp A, NP   10 mg at 11/18/19 0814  . fluticasone (FLONASE) 50 MCG/ACT nasal spray 1 spray  1 spray Each Nare Daily Lindon Romp A, NP   1 spray at 11/18/19 0814  . folic acid (FOLVITE) tablet 1 mg  1 mg Oral Daily Lindon Romp A, NP   1 mg at 11/18/19 0814  . gabapentin (NEURONTIN) capsule 100 mg  100 mg Oral TID Cobos, Myer Peer, MD   100 mg at 11/18/19 0818  . hydroxychloroquine (PLAQUENIL) tablet 200 mg  200 mg Oral BID Lindon Romp A, NP   200 mg at 11/18/19 5621  . hydrOXYzine (ATARAX/VISTARIL) tablet 25 mg  25 mg Oral Q6H PRN Connye Burkitt, NP      . loperamide (IMODIUM) capsule 2-4 mg  2-4 mg Oral PRN Connye Burkitt, NP      . LORazepam (ATIVAN) tablet 1 mg  1 mg Oral Q6H PRN Connye Burkitt, NP      . losartan (COZAAR) tablet 25 mg  25 mg Oral Daily Lindon Romp A, NP   25 mg at 11/18/19 0814  . magnesium hydroxide (MILK OF MAGNESIA) suspension 30 mL  30 mL Oral Daily PRN Lindon Romp A, NP      . montelukast (SINGULAIR) tablet 10 mg  10 mg Oral QHS Lindon Romp A, NP   10 mg at 11/17/19 2053  . multivitamin with minerals tablet 1 tablet  1 tablet Oral Daily Connye Burkitt, NP   1 tablet at 11/18/19 0818  . pantoprazole (PROTONIX) EC tablet 40 mg  40 mg Oral Daily Lindon Romp A, NP    40 mg at 11/18/19 0814  . sucralfate (CARAFATE) 1 GM/10ML suspension 1 g  1 g Oral TID PRN Lindon Romp A, NP      . thiamine tablet 100 mg  100 mg Oral Daily Connye Burkitt, NP   100 mg at 11/18/19 0814  . tiZANidine (ZANAFLEX) tablet 2 mg  2 mg Oral Q8H PRN Lindon Romp A, NP   2 mg at 11/17/19 2053  . topiramate (TOPAMAX) tablet 100 mg  100 mg Oral Daily Lindon Romp A, NP   100 mg at 11/18/19 0814  . triamterene-hydrochlorothiazide (MAXZIDE-25) 37.5-25 MG per tablet 1 tablet  1 tablet Oral Daily Lindon Romp A, NP   1 tablet at 11/18/19 3086   PTA Medications: Medications Prior to Admission  Medication Sig Dispense Refill Last Dose  . acetaminophen (TYLENOL) 325 MG tablet Take 650 mg by mouth every 6 (six) hours as needed for mild pain or headache.     . citalopram (CELEXA) 10 MG tablet Take 10 mg by mouth daily.     . fluticasone (FLONASE) 50 MCG/ACT nasal spray Place 1 spray into both nostrils daily.     Marland Kitchen  folic acid (FOLVITE) 1 MG tablet Take 1 mg by mouth daily.     Marland Kitchen gabapentin (NEURONTIN) 100 MG capsule Take 100 mg by mouth 3 (three) times daily.     . hydroxychloroquine (PLAQUENIL) 200 MG tablet Take 200 mg by mouth 2 (two) times daily.     Marland Kitchen losartan (COZAAR) 25 MG tablet Take 25 mg by mouth daily.     . montelukast (SINGULAIR) 10 MG tablet Take 10 mg by mouth at bedtime.     . pantoprazole (PROTONIX) 40 MG tablet Take 40 mg by mouth daily.     . sucralfate (CARAFATE) 1 GM/10ML suspension Take 1 g by mouth 3 (three) times daily as needed.     Marland Kitchen tiZANidine (ZANAFLEX) 2 MG tablet Take 2 mg by mouth every 8 (eight) hours as needed.     . topiramate (TOPAMAX) 100 MG tablet Take 100 mg by mouth daily.     Marland Kitchen triamterene-hydrochlorothiazide (MAXZIDE-25) 37.5-25 MG tablet Take 1 tablet by mouth daily.     Derrill Memo ON 11/23/2019] Vitamin D, Ergocalciferol, (DRISDOL) 1.25 MG (50000 UNIT) CAPS capsule Take 50,000 Units by mouth every 7 (seven) days. Mondays       Patient Stressors: Health  problems Marital or family conflict Medication change or noncompliance  Patient Strengths: Technical sales engineer for treatment/growth  Treatment Modalities: Medication Management, Group therapy, Case management,  1 to 1 session with clinician, Psychoeducation, Recreational therapy.   Physician Treatment Plan for Primary Diagnosis: <principal problem not specified> Long Term Goal(s): Improvement in symptoms so as ready for discharge Improvement in symptoms so as ready for discharge   Short Term Goals: Ability to identify changes in lifestyle to reduce recurrence of condition will improve Ability to verbalize feelings will improve Ability to disclose and discuss suicidal ideas Ability to demonstrate self-control will improve Ability to identify and develop effective coping behaviors will improve  Medication Management: Evaluate patient's response, side effects, and tolerance of medication regimen.  Therapeutic Interventions: 1 to 1 sessions, Unit Group sessions and Medication administration.  Evaluation of Outcomes: Not Met  Physician Treatment Plan for Secondary Diagnosis: Active Problems:   Severe recurrent major depression without psychotic features ([place])  Long Term Goal(s): Improvement in symptoms so as ready for discharge Improvement in symptoms so as ready for discharge   Short Term Goals: Ability to identify changes in lifestyle to reduce recurrence of condition will improve Ability to verbalize feelings will improve Ability to disclose and discuss suicidal ideas Ability to demonstrate self-control will improve Ability to identify and develop effective coping behaviors will improve     Medication Management: Evaluate patient's response, side effects, and tolerance of medication regimen.  Therapeutic Interventions: 1 to 1 sessions, Unit Group sessions and Medication administration.  Evaluation of Outcomes: Not Met   RN Treatment Plan for Primary  Diagnosis: <principal problem not specified> Long Term Goal(s): Knowledge of disease and therapeutic regimen to maintain health will improve  Short Term Goals: Ability to remain free from injury will improve, Ability to verbalize frustration and anger appropriately will improve, Ability to demonstrate self-control, Ability to participate in decision making will improve, Ability to verbalize feelings will improve, Ability to disclose and discuss suicidal ideas, Ability to identify and develop effective coping behaviors will improve and Compliance with prescribed medications will improve  Medication Management: RN will administer medications as ordered by provider, will assess and evaluate patient's response and provide education to patient for prescribed medication. RN will report any adverse  and/or side effects to prescribing provider.  Therapeutic Interventions: 1 on 1 counseling sessions, Psychoeducation, Medication administration, Evaluate responses to treatment, Monitor vital signs and CBGs as ordered, Perform/monitor CIWA, COWS, AIMS and Fall Risk screenings as ordered, Perform wound care treatments as ordered.  Evaluation of Outcomes: Not Met   LCSW Treatment Plan for Primary Diagnosis: <principal problem not specified> Long Term Goal(s): Safe transition to appropriate next level of care at discharge, Engage patient in therapeutic group addressing interpersonal concerns.  Short Term Goals: Engage patient in aftercare planning with referrals and resources, Increase social support, Increase ability to appropriately verbalize feelings, Increase emotional regulation, Facilitate acceptance of mental health diagnosis and concerns, Facilitate patient progression through stages of change regarding substance use diagnoses and concerns, Identify triggers associated with mental health/substance abuse issues and Increase skills for wellness and recovery  Therapeutic Interventions: Assess for all discharge  needs, 1 to 1 time with Social worker, Explore available resources and support systems, Assess for adequacy in community support network, Educate family and significant other(s) on suicide prevention, Complete Psychosocial Assessment, Interpersonal group therapy.  Evaluation of Outcomes: Not Met   Progress in Treatment: Attending groups: No. Participating in groups: No. Taking medication as prescribed: Yes. Toleration medication: Yes. Family/Significant other contact made: No, will contact:  Daughter Kathrina Crosley Patient understands diagnosis: Yes. Discussing patient identified problems/goals with staff: Yes. Medical problems stabilized or resolved: Yes. Denies suicidal/homicidal ideation: Yes. Issues/concerns per patient self-inventory: No. Other:   New problem(s) identified: No, Describe:  none  New Short Term/Long Term Goal(s):   Patient Goals:   Pt not in attendance  Discharge Plan or Barriers:   Reason for Continuation of Hospitalization: Medication stabilization  Estimated Length of Stay:  Attendees: Patient: Pt not in attendance 11/18/2019 10:52 AM  Physician:  11/18/2019 10:52 AM  Nursing:  11/18/2019 10:52 AM  RN Care Manager: 11/18/2019 10:52 AM  Social Worker: Macon Large 11/18/2019 10:52 AM  Recreational Therapist:  11/18/2019 10:52 AM  Other:  11/18/2019 10:52 AM  Other:  11/18/2019 10:52 AM  Other: 11/18/2019 10:52 AM    Scribe for Treatment Team: Bethann Berkshire, LCSW 11/18/2019 10:52 AM

## 2019-11-19 DIAGNOSIS — F332 Major depressive disorder, recurrent severe without psychotic features: Secondary | ICD-10-CM | POA: Diagnosis not present

## 2019-11-19 MED ORDER — GABAPENTIN 100 MG PO CAPS: 100.0000 mg | ORAL_CAPSULE | Freq: Three times a day (TID) | ORAL | 0 refills | Status: AC

## 2019-11-19 MED ORDER — TOPIRAMATE 100 MG PO TABS: 100.0000 mg | ORAL_TABLET | Freq: Every day | ORAL | 0 refills | Status: AC

## 2019-11-19 MED ORDER — HYDROXYZINE HCL 25 MG PO TABS: 25.0000 mg | ORAL_TABLET | Freq: Four times a day (QID) | ORAL | 0 refills | Status: DC | PRN

## 2019-11-19 MED ORDER — ONDANSETRON HCL 4 MG PO TABS
8.0000 mg | ORAL_TABLET | Freq: Three times a day (TID) | ORAL | Status: DC | PRN
  Administered 2019-11-19: 8 mg via ORAL
  Filled 2019-11-19: qty 2

## 2019-11-19 MED ORDER — CITALOPRAM HYDROBROMIDE 10 MG PO TABS: 10.0000 mg | ORAL_TABLET | Freq: Every day | ORAL | 0 refills | Status: DC

## 2019-11-19 NOTE — [facility] Suicide Risk Assessment (Signed)
[facility] Discharge Suicide Risk Assessment   Principal Problem: Depression Discharge Diagnoses: Active Problems:   Severe recurrent major depression without psychotic features ([facility])   Total Time spent with patient: 30 minutes  Musculoskeletal: Strength & Muscle Tone: within normal limits Gait & Station: normal Patient leans: N/A  Psychiatric Specialty Exam: Review of Systems no chest pain, no cough, no shortness of breath, reports nausea ( which she states is chronic/intermittent), no vomiting , no fever or chills   Blood pressure (!) 114/60, pulse 73, temperature 98 F (36.7 C), temperature source Oral, resp. rate 18, height 5' (1.524 m), weight 63 kg, SpO2 99 %.Body mass index is 27.15 kg/m.  General Appearance: Well Groomed  Eye Contact::  Good  Speech:  Normal Rate409  Volume:  Normal  Mood:  reports " I am feeling better"  Affect:  appropriate, reactive, fuller in range   Thought Process:  Linear and Descriptions of Associations: Intact  Orientation:  Full (Time, Place, and Person)  Thought Content:  denies hallucinations, no delusions, not internally preoccupied   Suicidal Thoughts:  No denies suicidal or self injurious ideations, denies homicidal or violent ideations  Homicidal Thoughts:  No  Memory:  recent and remote grossly intact   Judgement:  Other:  improving   Insight:  improving   Psychomotor Activity:  Normal  Concentration:  Good  Recall:  Good  Fund of Knowledge:Good  Language: Good  Akathisia:  Negative  Handed:  Right  AIMS (if indicated):     Assets:  Communication Skills Desire for Improvement Resilience  Sleep:  Number of Hours: 6.75  Cognition: WNL  ADL's:  Intact   Mental Status Per Nursing Assessment::   On Admission:  Suicidal ideation indicated by patient  Demographic Factors:  25, married, lives with husband, has three adult children, unemployed   Loss Factors: Strained relationship with adult daughters, recent relocation from  [place]-limited local social network  Historical Factors: No prior psychiatric admissions , no prior history of suicide attempts, history of prior depressive episode related to prior divorce . Reports she has been prescribed Celexa for several years . History of Lupus for which she is in treatment with Plaquenil.  Risk Reduction Factors:   Sense of responsibility to family, Living with another person, especially a relative, Positive social support and Positive coping skills or problem solving skills  Continued Clinical Symptoms:  Today patient presents alert, attentive, calm, pleasant on approach, mood is improved and states "I feel all right".  Affect is reactive, fuller in range, smiles/laughs appropriately during our session today.  No thought disorders noted.  Denies suicidal ideations.  No homicidal ideations.  No psychotic symptoms.  Currently future oriented.  States she plans to " start getting out more, meeting people". Currently she is hopeful for discharge, states she is missing her home/husband and her pet dogs. She states she does have a strained relationship with her adult daughter, who lives in [place].  States that her daughter " blames him for everything".  She states that she realizes that this is related to her daughter's own issues rather than a reflection on her. With her expressed consent I spoke with her husband.  Husband corroborates that patient is improving.  He also corroborated that relationship difficulties with daughter has been a stressor for patient.  He is in agreement with discharge today. Behavior on unit has been calm and in good control.  Pleasant on approach. Patient denies medication side effects.  We have reviewed side effect profile,  to include potential risk of QTc prolongation related to her medications.  As noted she reports she has been on Plaquenil for years (management of lupus), on Celexa ( for management of depression, anxiety), and states she intermittently  takes Phenergan at home for nausea. She has been on this medication combination for 10 + years .  I reviewed the importance of ensuring that her outpatient prescriber is aware/remains updated of all the medication she is taking. 7/28 EKG NSR, QTc 464   Cognitive Features That Contribute To Risk:  No gross cognitive deficits noted upon discharge. Is alert , attentive, and oriented x 3   Suicide Risk:  Mild:  Suicidal ideation of limited frequency, intensity, duration, and specificity.  There are no identifiable plans, no associated intent, mild dysphoria and related symptoms, good self-control (both objective and subjective assessment), few other risk factors, and identifiable protective factors, including available and accessible social support.   Follow-up Information    [facility]. Go on 11/20/2019.   Why: You have a hospital discharge appointment on 11/20/19 at 8:30 am  for medication management and therapy services.  This appointment will be held in person.  Please use the side entrance.  Contact information: [address] [place] [address] [phone]               Plan Of Care/Follow-up recommendations:  Activity:  As tolerated Diet:  Heart healthy Tests:  NA Other:  See below  Patient is expressing readiness for discharge.  No current grounds for involuntary commitment.  She is leaving unit in good spirits.  Plans to return home.  Plans to follow-up as above. Patient has an established PCP and Rheumatologist .  Craige Cotta, MD 11/19/2019, 10:41 AM

## 2019-11-19 NOTE — Progress Notes (Addendum)
D:  Patient denied SI and HI, contracts for safety.  Denied A/V hallucinations.  Denied pain. A:  Medications administered per MD orders.  Emotional support and encouragement given patient. R:  Safety maintained with 15 minute checks.  Patient's self inventory sheet, patient sleeps good, no sleep medication.  Good appetite, normal energy level, good concentration.  Denied depression, anxiety and hopeless.  Denied withdrawals.  Denied SI.  Denied physical problems.  Denied physical pain.  Goal is discharge.  Plans to think positive.  Plans to work on getting out more and making new friends.

## 2019-11-19 NOTE — Progress Notes (Signed)
Discharge Note:  Patient discharged home with husband.  Patient denied SI and HI.  Denied A/V hallucinations.  Suicide prevention information given and discussed with patient and stated she understood and had no questions.  Patient stated she received all her belongings, clothing, toiletries, misc items, etc.  Patient stated she appreciated all assistance from [facility] staff.  All required discharge information given to patient at discharge.

## 2019-11-19 NOTE — Progress Notes (Signed)
  [facility] Adult Case Management Discharge Plan :  Will you be returning to the same living situation after discharge:  Yes,  home with husband. At discharge, do you have transportation home?: Yes,  husband to pick up. Do you have the ability to pay for your medications: Yes,  has insurance.  Release of information consent forms completed and in the chart;  Patient's signature needed at discharge.  Patient to Follow up at:  Follow-up Information    [facility]. Go on 11/20/2019.   Why: You have a hospital discharge appointment on 11/20/19 at 8:30 am  for medication management and therapy services.  This appointment will be held in person.  Please use the side entrance.  Contact information: [address] [place] [address] [phone]               Next level of care provider has access to [facility] Link:no  Safety Planning and Suicide Prevention discussed: Yes,  with husband  Have you used any form of tobacco in the last 30 days? (Cigarettes, Smokeless Tobacco, Cigars, and/or Pipes): No  Has patient been referred to the Quitline?: N/A patient is not a smoker  Patient has been referred for addiction treatment: Pt. refused referral  Otelia Santee, LCSW 11/19/2019, 10:56 AM

## 2019-11-19 NOTE — Discharge Summary (Addendum)
Physician Discharge Summary Note  Patient:  Maria Pittman is an 59 y.o., female  MRN:  [id number]  DOB:  1961-03-28  Patient phone:  [phone] (home)   Patient address:   [address] [place] [id number],   Total Time spent with patient: Greater than 30 minutes  Date of Admission:  11/16/2019  Date of Discharge: 11-19-19  Reason for Admission: Overdose on Phenergan, Gabapentin and alcohol triggered by strained relationship with daughter.  Principal Problem: Severe recurrent major depression without psychotic features [facility](HCC)  Discharge Diagnoses: Principal Problem:   Severe recurrent major depression without psychotic features [facility](HCC)  Past Psychiatric History: Major depressive disorder,recurrent episodes.  Past Medical History:  Past Medical History:  Diagnosis Date  . Hypertension   . Lupus (HCC)    History reviewed. No pertinent surgical history.  Family History: History reviewed. No pertinent family history.  Family Psychiatric  History: See H&P  Social History:  Social History   Substance and Sexual Activity  Alcohol Use Yes     Social History   Substance and Sexual Activity  Drug Use Never    Social History   Socioeconomic History  . Marital status: Single    Spouse name: Not on file  . Number of children: Not on file  . Years of education: Not on file  . Highest education level: Not on file  Occupational History  . Not on file  Tobacco Use  . Smoking status: Never Smoker  . Smokeless tobacco: Never Used  Vaping Use  . Vaping Use: Never used  Substance and Sexual Activity  . Alcohol use: Yes  . Drug use: Never  . Sexual activity: Not Currently  Other Topics Concern  . Not on file  Social History Narrative  . Not on file   Social Determinants of Health   Financial Resource Strain:   . Difficulty of Paying Living Expenses:   Food Insecurity:   . Worried About Programme researcher, broadcasting/film/videounning Out of Food in the Last Year:   . Baristaan Out of Food in the Last Year:    Transportation Needs:   . Freight forwarderLack of Transportation (Medical):   Marland Kitchen. Lack of Transportation (Non-Medical):   Physical Activity:   . Days of Exercise per Week:   . Minutes of Exercise per Session:   Stress:   . Feeling of Stress :   Social Connections:   . Frequency of Communication with Friends and Family:   . Frequency of Social Gatherings with Friends and Family:   . Attends Religious Services:   . Active Member of Clubs or Organizations:   . Attends BankerClub or Organization Meetings:   Marland Kitchen. Marital Status:    Hospital Course: (Per Md's admission evaluation notes): 4858, married, lives with husband, has three adult children, unemployed. Moved to  from [place] earlier this year. She reports she went [facility] two days ago, following an overdose on Phenergan, Gabapentin and alcohol. She denies alcohol abuse and states she normally drinks " one or two drinks on weekends". She states that overdose was unplanned, impulsive and in the context of acute stressor, related to strained relationship with daughter. She also describes other stressors to include having a limited social support system following her relocation from out of state, husband working long hours. She reports that on the day of overdose, she had received a call from one of her adult daughters who was criticizing her, accusing her of being a bad mother. She states she had been dealing with some depression  related to above stressors but denies having had any suicidal or self injurious ideations leading up to event. Endorses some neuro-vegetative symptoms- endorses some anhedonia, decreased sleep, erratic energy level . Denies any psychotic symptoms.  After evaluation of her presenting symptoms, [name] was recommended for mood stabilization treatments. The medication regimen for her presenting symptoms were discussed & with her consent initiated. She received, stabilized & was discharged on the medications as listed below on her discharge medication  lists. She was also enrolled & participated in the group counseling sessions being offered & held on this unit. She learned coping skills. She presented on this admission, other pre-existing medical conditions that required treatment & or monitoring. She was resumed/discharged on all her pertinent home medications for those health issues. She tolerated her treatment regimen without any adverse effects or reactions reported.   During the course of her hospitalization, the 15-minute checks were adequate to ensure Rissie's safety. Patient did not display any dangerous, violent or suicidal behavior on the unit.  She interacted with patients & staff appropriately. She participated appropriately in the group sessions/therapies. Her medications were addressed & adjusted to meet her needs. She was recommended for outpatient follow-up care & medication management upon discharge to assure her continuity of care.  At the time of discharge patient is not reporting any acute suicidal/homicidal ideations. She feels more confident about her self & mental health care. She currently denies any new issues or concerns. Education and supportive counseling provided throughout her hospital stay & upon discharge.   Today upon her discharge evaluation with the attending psychiatrist, [name] shares she is doing well. She denies any other specific concerns. She is sleeping well. Her appetite is good. She denies other physical complaints. She denies AH/VH, delusional thoughts or paranoia. She feels that her medications have been helpful & is in agreement to continue her current treatment regimen as recommended. She was able to engage in safety planning including plan to return to [facility] or contact emergency services if she feels unable to maintain her own safety or the safety of others. Pt had no further questions, comments, or concerns. She left [facility] with all personal belongings in no apparent distress. Transportation per her  husband.  Physical Findings: AIMS:  , ,  ,  ,    CIWA:  CIWA-Ar Total: 0 COWS:     Musculoskeletal: Strength & Muscle Tone: within normal limits Gait & Station: normal Patient leans: N/A  Psychiatric Specialty Exam: Physical Exam Vitals and nursing note reviewed.  HENT:     Head: Normocephalic.     Nose: Nose normal.     Mouth/Throat:     Pharynx: Oropharynx is clear.  Eyes:     Pupils: Pupils are equal, round, and reactive to light.  Cardiovascular:     Rate and Rhythm: Normal rate.     Pulses: Normal pulses.  Pulmonary:     Effort: Pulmonary effort is normal.  Genitourinary:    Comments: Deferred Musculoskeletal:        General: Normal range of motion.     Cervical back: Normal range of motion.  Skin:    General: Skin is warm and dry.  Neurological:     Mental Status: She is alert and oriented to person, place, and time.     Review of Systems  Constitutional: Negative for chills, diaphoresis and fever.  HENT: Negative for congestion, rhinorrhea, sneezing and sore throat.   Eyes: Negative for discharge.  Respiratory: Negative for cough, chest tightness, shortness  of breath and wheezing.   Cardiovascular: Negative for chest pain and palpitations.  Gastrointestinal: Negative for diarrhea, nausea and vomiting.  Endocrine: Negative for cold intolerance.  Genitourinary: Negative for difficulty urinating.  Musculoskeletal: Negative for arthralgias and myalgias.  Allergic/Immunologic: Negative for environmental allergies and food allergies.       Allergies: Penicillins. Sulfa. Nsaids.  Neurological: Negative for dizziness, tremors, seizures, syncope, speech difficulty, light-headedness and headaches.  Psychiatric/Behavioral: Positive for dysphoric mood (Stabilized with medication prior to discharge) and sleep disturbance (Stabilized with medication prior to discharge.). Negative for agitation, behavioral problems, confusion, decreased concentration, hallucinations,  self-injury and suicidal ideas. The patient is not nervous/anxious (Stable upon discharge) and is not hyperactive.     Blood pressure (!) 114/60, pulse 73, temperature 98 F (36.7 C), temperature source Oral, resp. rate 18, height 5' (1.524 m), weight 63 kg, SpO2 99 %.Body mass index is 27.15 kg/m.  See Md's discharge SRA  Sleep:  Number of Hours: 6.75   Have you used any form of tobacco in the last 30 days? (Cigarettes, Smokeless Tobacco, Cigars, and/or Pipes): No  Has this patient used any form of tobacco in the last 30 days? (Cigarettes, Smokeless Tobacco, Cigars, and/or Pipes): N/A  Blood Alcohol level:  No results found for: [facility]  Metabolic Disorder Labs:  Lab Results  Component Value Date   HGBA1C 5.9 (H) 11/17/2019   MPG 122.63 11/17/2019   No results found for: PROLACTIN Lab Results  Component Value Date   CHOL 223 (H) 11/17/2019   TRIG 148 11/17/2019   HDL 89 11/17/2019   CHOLHDL 2.5 11/17/2019   VLDL 30 11/17/2019   LDLCALC 104 (H) 11/17/2019   See Psychiatric Specialty Exam and Suicide Risk Assessment completed by Attending Physician prior to discharge.  Discharge destination:  Home  Is patient on multiple antipsychotic therapies at discharge:  No   Has Patient had three or more failed trials of antipsychotic monotherapy by history:  No  Recommended Plan for Multiple Antipsychotic Therapies: NA  Allergies as of 11/19/2019      Reactions   Amoxicillin Anaphylaxis   Penicillins Anaphylaxis   Sulfa Antibiotics Swelling   Codeine Nausea Only   Nsaids Other (See Comments)   Ulcers      Medication List    STOP taking these medications   acetaminophen 325 MG tablet Commonly known as: TYLENOL     TAKE these medications     Indication  citalopram 10 MG tablet Commonly known as: CELEXA Take 1 tablet (10 mg total) by mouth daily. For depression What changed: additional instructions  Indication: Depression   fluticasone 50 MCG/ACT nasal spray Commonly  known as: FLONASE Place 1 spray into both nostrils daily.  Indication: Allergic Rhinitis, Signs and Symptoms of Nose Diseases   folic acid 1 MG tablet Commonly known as: FOLVITE Take 1 mg by mouth daily.  Indication: Anemia From Inadequate Folic Acid   gabapentin 100 MG capsule Commonly known as: NEURONTIN Take 1 capsule (100 mg total) by mouth 3 (three) times daily. For agitation What changed: additional instructions  Indication: Agitation   hydroxychloroquine 200 MG tablet Commonly known as: PLAQUENIL Take 200 mg by mouth 2 (two) times daily.  Indication: Systemic Lupus Erythematosus   hydrOXYzine 25 MG tablet Commonly known as: ATARAX/VISTARIL Take 1 tablet (25 mg total) by mouth every 6 (six) hours as needed (anxiety).  Indication: Feeling Anxious   losartan 25 MG tablet Commonly known as: COZAAR Take 25 mg by mouth daily.  Indication:  High Blood Pressure Disorder   montelukast 10 MG tablet Commonly known as: SINGULAIR Take 10 mg by mouth at bedtime.  Indication: Asthma   pantoprazole 40 MG tablet Commonly known as: PROTONIX Take 40 mg by mouth daily.  Indication: Gastroesophageal Reflux Disease   sucralfate 1 GM/10ML suspension Commonly known as: CARAFATE Take 1 g by mouth 3 (three) times daily as needed.  Indication: Ulcer of the Duodenum   tiZANidine 2 MG tablet Commonly known as: ZANAFLEX Take 2 mg by mouth every 8 (eight) hours as needed.  Indication: Muscle Spasticity, Musculoskeletal Pain   topiramate 100 MG tablet Commonly known as: TOPAMAX Take 1 tablet (100 mg total) by mouth daily. For mood stabilization What changed: additional instructions  Indication: Mood stabilization   triamterene-hydrochlorothiazide 37.5-25 MG tablet Commonly known as: MAXZIDE-25 Take 1 tablet by mouth daily.  Indication: High Blood Pressure Disorder   Vitamin D (Ergocalciferol) 1.25 MG (50000 UNIT) Caps capsule Commonly known as: DRISDOL Take 50,000 Units by mouth  every 7 (seven) days. Mondays Start taking on: November 23, 2019  Indication: Vitamin D Deficiency       Follow-up Information    Inc, Freight forwarder. Go on 11/20/2019.   Why: You have a hospital discharge appointment on 11/20/19 at 8:30 am  for medication management and therapy services.  This appointment will be held in person.  Please use the side entrance.  Contact information: [address] [place] [address] [phone]              Follow-up recommendations: Activity:  As tolerated Diet: As recommended by your primary care doctor. Keep all scheduled follow-up appointments as recommended.  Comments: Prescriptions given at discharge.  Patient agreeable to plan.  Given opportunity to ask questions.  Appears to feel comfortable with discharge denies any current suicidal or homicidal thought. Patient is also instructed prior to discharge to: Take all medications as prescribed by his/her mental healthcare provider. Report any adverse effects and or reactions from the medicines to his/her outpatient provider promptly. Patient has been instructed & cautioned: To not engage in alcohol and or illegal drug use while on prescription medicines. In the event of worsening symptoms, patient is instructed to call the crisis hotline, 911 and or go to the nearest ED for appropriate evaluation and treatment of symptoms. To follow-up with his/her primary care provider for your other medical issues, concerns and or health care needs.  Signed: Armandina Stammer, NP, PMHNP, FNP-BC 11/19/2019, 10:57 AM   Patient seen, Suicide Assessment Completed.  Disposition Plan Reviewed

## 2020-01-18 DIAGNOSIS — M79645 Pain in left finger(s): Secondary | ICD-10-CM | POA: Insufficient documentation

## 2020-02-25 DIAGNOSIS — L600 Ingrowing nail: Secondary | ICD-10-CM

## 2020-02-25 DIAGNOSIS — L851 Acquired keratosis [keratoderma] palmaris et plantaris: Secondary | ICD-10-CM

## 2020-02-25 HISTORY — DX: Acquired keratosis (keratoderma) palmaris et plantaris: L85.1

## 2020-02-25 HISTORY — DX: Ingrowing nail: L60.0

## 2020-03-29 ENCOUNTER — Other Ambulatory Visit: Payer: Self-pay | Admitting: Hematology and Oncology

## 2020-03-29 DIAGNOSIS — D509 Iron deficiency anemia, unspecified: Secondary | ICD-10-CM

## 2020-04-13 ENCOUNTER — Other Ambulatory Visit: Payer: Self-pay | Admitting: Oncology

## 2020-04-13 DIAGNOSIS — D509 Iron deficiency anemia, unspecified: Secondary | ICD-10-CM

## 2020-04-13 NOTE — Progress Notes (Signed)
[facility]  [address] [place],  [place]  [address] [phone]  Clinic Day:  04/14/2020  Referring physician: No ref. provider found   HISTORY OF PRESENT ILLNESS:  The patient is a 59 y.o. female with a history of iron deficiency anemia.  In the past, IV Feraheme was very effective in replenishing her iron stores and normalizing her hemoglobin.  She comes in today for routine follow up.  Since her last visit, the patient has been doing well.  She denies having increased fatigue or any overt forms of blood loss which concern her for recurrent iron deficiency anemia being an issue.  She continues to take oral iron on a daily basis.    PHYSICAL EXAM:  Blood pressure (!) 189/81, pulse 70, temperature 98.4 F (36.9 C), resp. rate 16, height 4\' 11"  (1.499 m), weight 181 lb 14.4 oz (82.5 kg), SpO2 99 %. Wt Readings from Last 3 Encounters:  04/14/20 181 lb 14.4 oz (82.5 kg)   Body mass index is 36.74 kg/m. Performance status (ECOG): 0 - Asymptomatic Physical Exam Constitutional:      Appearance: Normal appearance. She is not ill-appearing.  HENT:     Mouth/Throat:     Mouth: Mucous membranes are moist.     Pharynx: Oropharynx is clear. No oropharyngeal exudate or posterior oropharyngeal erythema.  Cardiovascular:     Rate and Rhythm: Normal rate and regular rhythm.     Heart sounds: No murmur heard. No friction rub. No gallop.   Pulmonary:     Effort: Pulmonary effort is normal. No respiratory distress.     Breath sounds: Normal breath sounds. No wheezing, rhonchi or rales.  Chest:  Breasts:     Right: No axillary adenopathy or supraclavicular adenopathy.     Left: No axillary adenopathy or supraclavicular adenopathy.    Abdominal:     General: Bowel sounds are normal. There is no distension.     Palpations: Abdomen is soft. There is no mass.     Tenderness: There is no abdominal tenderness.  Musculoskeletal:        General: No swelling.      Right lower leg: No edema.     Left lower leg: No edema.  Lymphadenopathy:     Cervical: No cervical adenopathy.     Upper Body:     Right upper body: No supraclavicular or axillary adenopathy.     Left upper body: No supraclavicular or axillary adenopathy.     Lower Body: No right inguinal adenopathy. No left inguinal adenopathy.  Skin:    General: Skin is warm.     Coloration: Skin is not jaundiced.     Findings: No lesion or rash.  Neurological:     General: No focal deficit present.     Mental Status: She is alert and oriented to person, place, and time. Mental status is at baseline.     Cranial Nerves: Cranial nerves are intact.  Psychiatric:        Mood and Affect: Mood normal.        Behavior: Behavior normal.        Thought Content: Thought content normal.     LABS:   CBC Latest Ref Rng & Units 04/14/2020 11/17/2019  WBC - 6.4 6.6  Hemoglobin 12.0 - 16.0 14.3 14.0  Hematocrit 36 - 46 32(A) 43.4  Platelets 150 - 399 349 456(H)   CMP Latest Ref Rng & Units 11/17/2019  Glucose 70 - 99 mg/dL  96  BUN 6 - 20 mg/dL 16  Creatinine 6.07 - 3.71 mg/dL 0.62  Sodium 694 - 854 mmol/L 142  Potassium 3.5 - 5.1 mmol/L 4.3  Chloride 98 - 111 mmol/L 108  CO2 22 - 32 mmol/L 21(L)  Calcium 8.9 - 10.3 mg/dL 9.7  Total Protein 6.5 - 8.1 g/dL 6.7  Total Bilirubin 0.3 - 1.2 mg/dL 0.7  Alkaline Phos 38 - 126 U/L 86  AST 15 - 41 U/L 22  ALT 0 - 44 U/L 18      ASSESSMENT & PLAN:   Assessment/Plan:  A 59 y.o. female with iron deficiency anemia.  I am pleased as both her hemoglobin and iron levels are even better now than what they were previously.  I have no problem with her continuing to take oral iron.  I will see this patient back in 6 months for repeat clinical assessment. The patient understands all the plans discussed today and is in agreement with them.    Brittainy Bucker Kirby Funk, MD

## 2020-04-14 ENCOUNTER — Inpatient Hospital Stay: Payer: Medicaid Other | Attending: Oncology

## 2020-04-14 ENCOUNTER — Other Ambulatory Visit: Payer: Self-pay | Admitting: Hematology and Oncology

## 2020-04-14 ENCOUNTER — Inpatient Hospital Stay (INDEPENDENT_AMBULATORY_CARE_PROVIDER_SITE_OTHER): Payer: Medicaid Other | Admitting: Oncology

## 2020-04-14 ENCOUNTER — Other Ambulatory Visit: Payer: Self-pay | Admitting: Oncology

## 2020-04-14 ENCOUNTER — Telehealth: Payer: Self-pay | Admitting: Oncology

## 2020-04-14 ENCOUNTER — Other Ambulatory Visit: Payer: Self-pay

## 2020-04-14 ENCOUNTER — Telehealth: Payer: Self-pay

## 2020-04-14 VITALS — BP 189/81 | HR 70 | Temp 98.4°F | Resp 16 | Ht 59.0 in | Wt 181.9 lb

## 2020-04-14 DIAGNOSIS — D509 Iron deficiency anemia, unspecified: Secondary | ICD-10-CM

## 2020-04-14 LAB — CBC AND DIFFERENTIAL
HCT: 32 — AB (ref 36–46)
Hemoglobin: 14.3 (ref 12.0–16.0)
Neutrophils Absolute: 2.69
Platelets: 349 (ref 150–399)
WBC: 6.4

## 2020-04-14 LAB — IRON,TIBC AND FERRITIN PANEL
%SAT: 32
Ferritin: 133
Iron: 78
TIBC: 242

## 2020-04-14 LAB — CBC
MCV: 92 (ref 81–99)
RBC: 4.49 (ref 3.87–5.11)

## 2020-04-14 NOTE — Telephone Encounter (Signed)
Per 12/23 LOS, patient scheduled for June 2022 Appt's.  Gave patient Appt Summary

## 2020-04-14 NOTE — Telephone Encounter (Signed)
Pt called to request her ferritin results. I notified Dr Melvyn Neth. Her ferritin is much better, pt can continue taking daily iron if she would like. Will f/u in 6 months.  I called pt back, notified her of above. She verbalized understanding.

## 2020-06-10 DIAGNOSIS — I10 Essential (primary) hypertension: Secondary | ICD-10-CM

## 2020-06-10 DIAGNOSIS — R079 Chest pain, unspecified: Secondary | ICD-10-CM

## 2020-06-27 ENCOUNTER — Encounter: Payer: Self-pay | Admitting: *Deleted

## 2020-06-27 ENCOUNTER — Encounter: Payer: Self-pay | Admitting: Cardiology

## 2020-07-18 ENCOUNTER — Encounter: Payer: Self-pay | Admitting: Cardiology

## 2020-07-18 ENCOUNTER — Other Ambulatory Visit: Payer: Self-pay

## 2020-07-18 ENCOUNTER — Ambulatory Visit (INDEPENDENT_AMBULATORY_CARE_PROVIDER_SITE_OTHER): Payer: Medicaid Other | Admitting: Cardiology

## 2020-07-18 VITALS — BP 134/76 | HR 59 | Ht 60.0 in | Wt 175.0 lb

## 2020-07-18 DIAGNOSIS — E785 Hyperlipidemia, unspecified: Secondary | ICD-10-CM

## 2020-07-18 DIAGNOSIS — K279 Peptic ulcer, site unspecified, unspecified as acute or chronic, without hemorrhage or perforation: Secondary | ICD-10-CM

## 2020-07-18 DIAGNOSIS — K2270 Barrett's esophagus without dysplasia: Secondary | ICD-10-CM | POA: Diagnosis not present

## 2020-07-18 DIAGNOSIS — R0789 Other chest pain: Secondary | ICD-10-CM

## 2020-07-18 HISTORY — DX: Hyperlipidemia, unspecified: E78.5

## 2020-07-18 HISTORY — DX: Other chest pain: R07.89

## 2020-07-18 NOTE — Progress Notes (Signed)
Cardiology Consultation:    Date:  07/18/2020   ID:  Maria Pittman, DOB 17-Mar-1961, MRN [id number]  PCP:  Dema Severin, NP  Cardiologist:  Gypsy Balsam, MD   Referring MD: Dema Severin, NP   Chief Complaint  Patient presents with  . Chest Pain    History of Present Illness:    Maria Pittman is a 60 y.o. female who is being seen today for the evaluation of chest pain at the request of York, Regina F, NP. With past medical history significant for autoimmune disorder, she was apparently told that she got lupus however lately they said that she does not have it, essential hypertension, dyslipidemia, peptic ulcer disease, Barrett's esophagus.  She was referred to [place] because of episode of chest pain.  She described a sensation of tightness or heaviness that happens typically at night.  Sometimes she said she has to get up and walk around because of the sensation in the chest and usually when she does this goes away.  She also described those episodes during the day and typically happen at rest.  She does have dogs that she like to walk with.  She described few times that situation a car leading to also some chest sensation when she was walking dog.  Recently also she had some changes in her medication and since that time she denies have any chest pain.  She said she used to get those episodes 2-3 times a week but now she remember last time having episode about 3 to 4 weeks ago. She does have family history of multiple family members with premature coronary artery disease but all of them were heavy smokers, She never smoked. She does have dyslipidemia with LDL of 104 and HDL 89. She does not exercise on the regular basis but does have dogs she walks with those dogs.  Past Medical History:  Diagnosis Date  . Acquired plantar porokeratosis 02/25/2020  . Barrett's esophagus without dysplasia 04/20/2019  . Capsulitis of metatarsophalangeal (MTP) joint of right foot 11/10/2019  . Hammer toe of  right foot 11/10/2019  . Hyperlipidemia   . Hypertension   . Ingrown nail 02/25/2020  . Iron deficiency anemia, unspecified 04/24/2018  . Lupus (HCC)   . Morton's neuroma of third interspace of right foot 11/10/2019  . Other migraine, intractable, without status migrainosus   . Peptic ulcer disease 04/20/2019  . Severe recurrent major depression without psychotic features (HCC) 11/16/2019  . Undifferentiated connective tissue disease (HCC) 07/23/2018    Past Surgical History:  Procedure Laterality Date  . ABDOMINAL SURGERY     Mesh. 2012 with removal in 2014  . CERVICAL ABLATION  2007  . CESAREAN SECTION     X3 1988, 1997, 1999  . HAND SURGERY Right 08/2019   Thumb at [facility]  . ORTHOPEDIC SURGERY Left 1998   Knee  . ROTATOR CUFF REPAIR Right 2012  . SPLENECTOMY    . STOMACH SURGERY     X5 2001-2009    Current Medications: Current Meds  Medication Sig  . Cholecalciferol 1.25 MG (50000 UT) capsule Take 1 capsule by mouth once a week. Take 1 capsule by mouth once a week for 12 weeks  . citalopram (CELEXA) 20 MG tablet Take 20 mg by mouth daily.  . fluticasone (FLONASE) 50 MCG/ACT nasal spray Place 1 spray into both nostrils daily.  . folic acid (FOLVITE) 1 MG tablet Take 1 mg by mouth daily.  Marland Kitchen gabapentin (NEURONTIN) 100  MG capsule Take 1 capsule (100 mg total) by mouth 3 (three) times daily. For agitation  . hydroxychloroquine (PLAQUENIL) 200 MG tablet Take 200 mg by mouth 2 (two) times daily.  Marland Kitchen losartan (COZAAR) 25 MG tablet Take 25 mg by mouth daily.  . montelukast (SINGULAIR) 10 MG tablet Take 10 mg by mouth at bedtime.  . Multiple Vitamins-Minerals (ONE-A-DAY WOMENS PO) Take 1 tablet by mouth daily. Unknown strength per patient  . pantoprazole (PROTONIX) 40 MG tablet Take 40 mg by mouth 2 (two) times daily.  . promethazine (PHENERGAN) 25 MG tablet Take 25 mg by mouth daily as needed for nausea.  . rizatriptan (MAXALT) 10 MG tablet Take 10 mg by mouth as needed for  headache. May repeat in 2 hours if needed  . sucralfate (CARAFATE) 1 GM/10ML suspension Take 1 g by mouth 3 (three) times daily as needed (Acid reflux).  Marland Kitchen tiZANidine (ZANAFLEX) 2 MG tablet Take 2 mg by mouth every 8 (eight) hours as needed for muscle spasms.  Marland Kitchen topiramate (TOPAMAX) 100 MG tablet Take 1 tablet (100 mg total) by mouth daily. For mood stabilization  . triamterene-hydrochlorothiazide (MAXZIDE-25) 37.5-25 MG tablet Take 1 tablet by mouth daily.     Allergies:   Amoxicillin, Penicillins, Sulfa antibiotics, Codeine, Nsaids, Benzoin, Latex, and Wound dressing adhesive   Social History   Socioeconomic History  . Marital status: Single    Spouse name: Not on file  . Number of children: Not on file  . Years of education: Not on file  . Highest education level: Not on file  Occupational History  . Not on file  Tobacco Use  . Smoking status: Never Smoker  . Smokeless tobacco: Never Used  Vaping Use  . Vaping Use: Never used  Substance and Sexual Activity  . Alcohol use: Yes  . Drug use: Never  . Sexual activity: Not Currently  Other Topics Concern  . Not on file  Social History Narrative  . Not on file   Social Determinants of Health   Financial Resource Strain: Not on file  Food Insecurity: Not on file  Transportation Needs: Not on file  Physical Activity: Not on file  Stress: Not on file  Social Connections: Not on file     Family History: The patient's family history includes Cancer in her mother; Colon cancer in her father; Diabetes in her mother; Heart disease in her father. ROS:   Please see the history of present illness.    All 14 point review of systems negative except as described per history of present illness.  EKGs/Labs/Other Studies Reviewed:    The following studies were reviewed today: Echocardiogram done in the hospital reviewed that was done just few weeks ago showed normal left ventricle ejection fraction, relaxation abnormality, trivial  mitral regurgitation.  EKG:  EKG is  ordered today.  The ekg ordered today demonstrates sinus bradycardia rate of 58, no ST segment changes  Recent Labs: 11/17/2019: ALT 18; BUN 16; Creatinine, Ser 0.86; Potassium 4.3; Sodium 142; TSH 2.632 04/14/2020: Hemoglobin 14.3; Platelets 349  Recent Lipid Panel    Component Value Date/Time   CHOL 223 (H) 11/17/2019 0622   TRIG 148 11/17/2019 0622   HDL 89 11/17/2019 0622   CHOLHDL 2.5 11/17/2019 0622   VLDL 30 11/17/2019 0622   LDLCALC 104 (H) 11/17/2019 0622    Physical Exam:    VS:  BP 134/76 (BP Location: Right Arm, Patient Position: Sitting)   Pulse (!) 59   Ht 5' (  1.524 m)   Wt 175 lb (79.4 kg)   SpO2 99%   BMI 34.18 kg/m     Wt Readings from Last 3 Encounters:  07/18/20 175 lb (79.4 kg)  05/31/20 173 lb (78.5 kg)  04/14/20 181 lb 14.4 oz (82.5 kg)     GEN:  Well nourished, well developed in no acute distress HEENT: Normal NECK: No JVD; No carotid bruits LYMPHATICS: No lymphadenopathy CARDIAC: RRR, no murmurs, no rubs, no gallops RESPIRATORY:  Clear to auscultation without rales, wheezing or rhonchi  ABDOMEN: Soft, non-tender, non-distended MUSCULOSKELETAL:  No edema; No deformity  SKIN: Warm and dry NEUROLOGIC:  Alert and oriented x 3 PSYCHIATRIC:  Normal affect   ASSESSMENT:    1. Peptic ulcer disease   2. Atypical chest pain   3. Barrett's esophagus without dysplasia   4. Dyslipidemia    PLAN:    In order of problems listed above:  1. Atypical chest pain.  Happening majority of time at rest and at night, recent changes to her medications make pain go away.  She did describe however to have some episodes when she was walking the dog which make me worry.  I will schedule him to have Lexiscan make sure she does not have any inducible ischemia.  I will not change any of her medication right now she is see she became asymptomatic last few weeks.  I will not put her on antiplatelet therapy since she does have history  of peptic ulcer disease. 2. Autoimmune disorder she was told years ago she had SLE however now she tells me that this diagnosis has been strike out.  She is taking some Plaquenil with good response. 3. Dyslipidemia I told her that her next time will calculate her 10 years risk to decide about potential treatment for it.  I will also wait for results of stress test. 4. Peptic ulcer disease with Barrett's esophagus without dysplasia.  That may be contributing to her symptoms.  Begin some change in her medication has been made since that time she is asymptomatic.    Medication Adjustments/Labs and Tests Ordered: Current medicines are reviewed at length with the patient today.  Concerns regarding medicines are outlined above.  No orders of the defined types were placed in this encounter.  No orders of the defined types were placed in this encounter.   Signed, Georgeanna Lea, MD, [facility]. 07/18/2020 10:39 AM    [facility]

## 2020-07-18 NOTE — Patient Instructions (Signed)
Medication Instructions:  Your physician recommends that you continue on your current medications as directed. Please refer to the Current Medication list given to you today.  *If you need a refill on your cardiac medications before your next appointment, please call your pharmacy*   Lab Work: None If you have labs (blood work) drawn today and your tests are completely normal, you will receive your results only by: . MyChart Message (if you have MyChart) OR . A paper copy in the mail If you have any lab test that is abnormal or we need to change your treatment, we will call you to review the results.   Testing/Procedures:   [facility] [address] [place], [place] [address] Phone:  [phone]    Please arrive 15 minutes prior to your appointment time for registration and insurance purposes.  The test will take approximately 3 to 4 hours to complete; you may bring reading material.  If someone comes with you to your appointment, they will need to remain in the main lobby due to limited space in the testing area. **If you are pregnant or breastfeeding, please notify the nuclear lab prior to your appointment**  How to prepare for your Myocardial Perfusion Test: . Do not eat or drink 3 hours prior to your test, except you may have water. . Do not consume products containing caffeine (regular or decaffeinated) 12 hours prior to your test. (ex: coffee, chocolate, sodas, tea). . Do bring a list of your current medications with you.  If not listed below, you may take your medications as normal.  . Do wear comfortable clothes (no dresses or overalls) and walking shoes, tennis shoes preferred (No heels or open toe shoes are allowed). . Do NOT wear cologne, perfume, aftershave, or lotions (deodorant is allowed). . If these instructions are not followed, your test will have to be rescheduled.  Please report to [address] for your test.  If you have  questions or concerns about your appointment, you can call the [facility] at [phone].  If you cannot keep your appointment, please provide 24 hours notification to the Nuclear Lab, to avoid a possible $50 charge to your account.   Follow-Up: At [facility], you and your health needs are our priority.  As part of our continuing mission to provide you with exceptional heart care, we have created designated Provider Care Teams.  These Care Teams include your primary Cardiologist (physician) and Advanced Practice Providers (APPs -  Physician Assistants and Nurse Practitioners) who all work together to provide you with the care you need, when you need it.  We recommend signing up for the patient portal called "MyChart".  Sign up information is provided on this After Visit Summary.  MyChart is used to connect with patients for Virtual Visits (Telemedicine).  Patients are able to view lab/test results, encounter notes, upcoming appointments, etc.  Non-urgent messages can be sent to your provider as well.   To learn more about what you can do with MyChart, go to [email or web address].    Your next appointment:   6 week(s)  The format for your next appointment:   In Person  Provider:   Robert Krasowski, MD   Other Instructions   Cardiac Nuclear Scan A cardiac nuclear scan is a test that measures blood flow to the heart when a person is resting and when he or she is exercising. The test looks for problems such as:  Not   Not enough blood reaching a portion of the heart.  The heart muscle not working normally. You may need this test if:  You have heart disease.  You have had abnormal lab results.  You have had heart surgery or a balloon procedure to open up blocked arteries (angioplasty).  You have chest pain.  You have shortness of breath. In this test, a radioactive dye (tracer) is injected into your bloodstream. After the tracer has traveled to  your heart, an imaging device is used to measure how much of the tracer is absorbed by or distributed to various areas of your heart. This procedure is usually done at a hospital and takes 2-4 hours. Tell a health care provider about:  Any allergies you have.  All medicines you are taking, including vitamins, herbs, eye drops, creams, and over-the-counter medicines.  Any problems you or family members have had with anesthetic medicines.  Any blood disorders you have.  Any surgeries you have had.  Any medical conditions you have.  Whether you are pregnant or may be pregnant. What are the risks? Generally, this is a safe procedure. However, problems may occur, including:  Serious chest pain and heart attack. This is only a risk if the stress portion of the test is done.  Rapid heartbeat.  Sensation of warmth in your chest. This usually passes quickly.  Allergic reaction to the tracer. What happens before the procedure?  Ask your health care provider about changing or stopping your regular medicines. This is especially important if you are taking diabetes medicines or blood thinners.  Follow instructions from your health care provider about eating or drinking restrictions.  Remove your jewelry on the day of the procedure. What happens during the procedure?  An IV will be inserted into one of your veins.  Your health care provider will inject a small amount of radioactive tracer through the IV.  You will wait for 20-40 minutes while the tracer travels through your bloodstream.  Your heart activity will be monitored with an electrocardiogram (ECG).  You will lie down on an exam table.  Images of your heart will be taken for about 15-20 minutes.  You may also have a stress test. For this test, one of the following may be done: ? You will exercise on a treadmill or stationary bike. While you exercise, your heart's activity will be monitored with an ECG, and your blood  pressure will be checked. ? You will be given medicines that will increase blood flow to parts of your heart. This is done if you are unable to exercise.  When blood flow to your heart has peaked, a tracer will again be injected through the IV.  After 20-40 minutes, you will get back on the exam table and have more images taken of your heart.  Depending on the type of tracer used, scans may need to be repeated 3-4 hours later.  Your IV line will be removed when the procedure is over. The procedure may vary among health care providers and hospitals. What happens after the procedure?  Unless your health care provider tells you otherwise, you may return to your normal schedule, including diet, activities, and medicines.  Unless your health care provider tells you otherwise, you may increase your fluid intake. This will help to flush the contrast dye from your body. Drink enough fluid to keep your urine pale yellow.  Ask your health care provider, or the department that is doing the test: ? When will my results   be ready? ? How will I get my results? Summary  A cardiac nuclear scan measures the blood flow to the heart when a person is resting and when he or she is exercising.  Tell your health care provider if you are pregnant.  Before the procedure, ask your health care provider about changing or stopping your regular medicines. This is especially important if you are taking diabetes medicines or blood thinners.  After the procedure, unless your health care provider tells you otherwise, increase your fluid intake. This will help flush the contrast dye from your body.  After the procedure, unless your health care provider tells you otherwise, you may return to your normal schedule, including diet, activities, and medicines. This information is not intended to replace advice given to you by your health care provider. Make sure you discuss any questions you have with your health care  provider. Document Revised: 09/23/2017 Document Reviewed: 09/23/2017 Elsevier Patient Education  [place].

## 2020-07-18 NOTE — Addendum Note (Signed)
Addended by: Hazle Quant on: 07/18/2020 10:49 AM   Modules accepted: Orders

## 2020-08-02 ENCOUNTER — Telehealth (HOSPITAL_COMMUNITY): Payer: Self-pay | Admitting: *Deleted

## 2020-08-02 NOTE — Addendum Note (Signed)
Addended by: Gypsy Balsam on: 08/02/2020 03:16 PM   Modules accepted: Orders

## 2020-08-02 NOTE — Telephone Encounter (Signed)
Patient given detailed instructions per Myocardial Perfusion Study Information Sheet for the test on 08/09/20 at 11:00. Patient notified to arrive 15 minutes early and that it is imperative to arrive on time for appointment to keep from having the test rescheduled.  If you need to cancel or reschedule your appointment, please call the office within 24 hours of your appointment. . Patient verbalized understanding.Daneil Dolin

## 2020-08-24 ENCOUNTER — Other Ambulatory Visit: Payer: Self-pay

## 2020-08-24 DIAGNOSIS — I10 Essential (primary) hypertension: Secondary | ICD-10-CM | POA: Insufficient documentation

## 2020-08-24 DIAGNOSIS — M329 Systemic lupus erythematosus, unspecified: Secondary | ICD-10-CM | POA: Insufficient documentation

## 2020-08-24 DIAGNOSIS — G43819 Other migraine, intractable, without status migrainosus: Secondary | ICD-10-CM | POA: Insufficient documentation

## 2020-08-24 DIAGNOSIS — E785 Hyperlipidemia, unspecified: Secondary | ICD-10-CM | POA: Insufficient documentation

## 2020-08-25 ENCOUNTER — Telehealth (HOSPITAL_COMMUNITY): Payer: Self-pay | Admitting: *Deleted

## 2020-08-25 NOTE — Telephone Encounter (Signed)
Patient given detailed instructions per Myocardial Perfusion Study Information Sheet for the test on 08/30/20 at 11:00. Patient notified to arrive 15 minutes early and that it is imperative to arrive on time for appointment to keep from having the test rescheduled.  If you need to cancel or reschedule your appointment, please call the office within 24 hours of your appointment. . Patient verbalized understanding.Maria Pittman

## 2020-08-29 ENCOUNTER — Ambulatory Visit: Payer: Medicaid Other | Admitting: Cardiology

## 2020-08-30 ENCOUNTER — Other Ambulatory Visit: Payer: Self-pay

## 2020-08-30 ENCOUNTER — Ambulatory Visit (INDEPENDENT_AMBULATORY_CARE_PROVIDER_SITE_OTHER): Payer: Medicaid Other

## 2020-08-30 DIAGNOSIS — R0789 Other chest pain: Secondary | ICD-10-CM | POA: Diagnosis not present

## 2020-08-30 LAB — MYOCARDIAL PERFUSION IMAGING
LV dias vol: 62 mL (ref 46–106)
LV sys vol: 26 mL
Peak HR: 86 {beats}/min
Rest HR: 61 {beats}/min
SDS: 2
SRS: 3
SSS: 5
TID: 1.06

## 2020-08-30 MED ORDER — TECHNETIUM TC 99M TETROFOSMIN IV KIT
9.8000 | PACK | Freq: Once | INTRAVENOUS | Status: AC | PRN
  Administered 2020-08-30: 9.8 via INTRAVENOUS

## 2020-08-30 MED ORDER — TECHNETIUM TC 99M TETROFOSMIN IV KIT
29.6000 | PACK | Freq: Once | INTRAVENOUS | Status: AC | PRN
  Administered 2020-08-30: 29.6 via INTRAVENOUS

## 2020-08-30 MED ORDER — REGADENOSON 0.4 MG/5ML IV SOLN
0.4000 mg | Freq: Once | INTRAVENOUS | Status: AC
  Administered 2020-08-30: 0.4 mg via INTRAVENOUS

## 2020-10-13 ENCOUNTER — Inpatient Hospital Stay: Payer: Medicaid Other

## 2020-10-13 ENCOUNTER — Inpatient Hospital Stay: Payer: Medicaid Other | Admitting: Oncology

## 2020-10-14 NOTE — Progress Notes (Signed)
[facility]  [address] [place],  [place]  [address] [phone]  Clinic Day:  10/18/2020  Referring physician: Dema Severin, NP  This document serves as a record of services personally performed by Weston Settle, MD. It was created on their behalf by Curry,Lauren E, a trained medical scribe. The creation of this record is based on the scribe's personal observations and the provider's statements to them.  HISTORY OF PRESENT ILLNESS:  The patient is a 60 y.o. female with a history of iron deficiency anemia.  In the past, IV Feraheme was very effective in replenishing her iron stores and normalizing her hemoglobin.  She comes in today for routine follow up.  Since her last visit, the patient has been doing well.  She denies having increased fatigue or any overt forms of blood loss which concern her for recurrent iron deficiency anemia being an issue.   PHYSICAL EXAM:  Blood pressure (!) 159/73, pulse 67, temperature 98.8 F (37.1 C), resp. rate 16, height 5' (1.524 m), weight 177 lb 14.4 oz (80.7 kg), SpO2 99 %. Wt Readings from Last 3 Encounters:  10/18/20 177 lb 14.4 oz (80.7 kg)  08/30/20 175 lb (79.4 kg)  07/18/20 175 lb (79.4 kg)   Body mass index is 34.74 kg/m. Performance status (ECOG): 0 - Asymptomatic Physical Exam Constitutional:      Appearance: Normal appearance. She is not ill-appearing.  HENT:     Mouth/Throat:     Mouth: Mucous membranes are moist.     Pharynx: Oropharynx is clear. No oropharyngeal exudate or posterior oropharyngeal erythema.  Cardiovascular:     Rate and Rhythm: Normal rate and regular rhythm.     Heart sounds: No murmur heard.   No friction rub. No gallop.  Pulmonary:     Effort: Pulmonary effort is normal. No respiratory distress.     Breath sounds: Normal breath sounds. No wheezing, rhonchi or rales.  Chest:  Breasts:    Right: No axillary adenopathy or supraclavicular adenopathy.     Left: No  axillary adenopathy or supraclavicular adenopathy.  Abdominal:     General: Bowel sounds are normal. There is no distension.     Palpations: Abdomen is soft. There is no mass.     Tenderness: There is no abdominal tenderness.  Musculoskeletal:        General: No swelling.     Right lower leg: No edema.     Left lower leg: No edema.  Lymphadenopathy:     Cervical: No cervical adenopathy.     Upper Body:     Right upper body: No supraclavicular or axillary adenopathy.     Left upper body: No supraclavicular or axillary adenopathy.     Lower Body: No right inguinal adenopathy. No left inguinal adenopathy.  Skin:    General: Skin is warm.     Coloration: Skin is not jaundiced.     Findings: No lesion or rash.  Neurological:     General: No focal deficit present.     Mental Status: She is alert and oriented to person, place, and time. Mental status is at baseline.     Cranial Nerves: Cranial nerves are intact.  Psychiatric:        Mood and Affect: Mood normal.        Behavior: Behavior normal.        Thought Content: Thought content normal.    LABS:   CBC Latest Ref Rng & Units 10/18/2020  04/14/2020 11/17/2019  WBC - 7.9 6.4 6.6  Hemoglobin 12.0 - 16.0 14.3 14.3 14.0  Hematocrit 36 - 46 42 32(A) 43.4  Platelets 150 - 399 358 349 456(H)   CMP Latest Ref Rng & Units 10/18/2020 11/17/2019  Glucose 70 - 99 mg/dL - 96  BUN 4 - 21 15 16   Creatinine 0.5 - 1.1 0.9 0.86  Sodium 137 - 147 139 142  Potassium 3.4 - 5.3 3.8 4.3  Chloride 99 - 108 106 108  CO2 13 - 22 25(A) 21(L)  Calcium 8.7 - 10.7 9.2 9.7  Total Protein 6.5 - 8.1 g/dL - 6.7  Total Bilirubin 0.3 - 1.2 mg/dL - 0.7  Alkaline Phos 25 - 125 104 86  AST 13 - 35 32 22  ALT 7 - 35 23 18    Ref. Range 10/18/2020 14:37  Iron Latest Ref Range: 28 - 170 ug/dL 10/20/2020  UIBC Latest Units: ug/dL 287  TIBC Latest Ref Range: 250 - 450 ug/dL 867  Saturation Ratios Latest Ref Range: 10.4 - 31.8 % 34 (H)  Ferritin Latest Ref Range: 11 -  307 ng/mL 145   ASSESSMENT & PLAN:   Assessment/Plan:  A 60 y.o. female with a history of iron deficiency anemia.  I am pleased as both her hemoglobin and iron levels remain excellent.  Furthermore, she has not been taking any iron for numerous months.  Clinically, the patient is doing well.  As she has no further pressing hematologic issues, I do feel comfortable turning her care back over to her primary care office.  My recommendation is for her CBC to be checked 2-3 times per year.  I would have no problem seeing her in the future if new hematologic issues arise which require repeat clinical assessment.  The patient understands all the plans discussed today and is in agreement with them.     I, 47, am acting as scribe for Foye Deer, MD    I have reviewed this report as typed by the medical scribe, and it is complete and accurate.  Dequincy Weston Settle, MD

## 2020-10-18 ENCOUNTER — Inpatient Hospital Stay (INDEPENDENT_AMBULATORY_CARE_PROVIDER_SITE_OTHER): Payer: Medicaid Other | Admitting: Oncology

## 2020-10-18 ENCOUNTER — Encounter: Payer: Self-pay | Admitting: Oncology

## 2020-10-18 ENCOUNTER — Other Ambulatory Visit: Payer: Self-pay

## 2020-10-18 ENCOUNTER — Inpatient Hospital Stay: Payer: Medicaid Other | Attending: Oncology

## 2020-10-18 VITALS — BP 159/73 | HR 67 | Temp 98.8°F | Resp 16 | Ht 60.0 in | Wt 177.9 lb

## 2020-10-18 DIAGNOSIS — D509 Iron deficiency anemia, unspecified: Secondary | ICD-10-CM | POA: Insufficient documentation

## 2020-10-18 DIAGNOSIS — D508 Other iron deficiency anemias: Secondary | ICD-10-CM

## 2020-10-18 LAB — CBC AND DIFFERENTIAL
HCT: 42 (ref 36–46)
Hemoglobin: 14.3 (ref 12.0–16.0)
Neutrophils Absolute: 4.11
Platelets: 358 (ref 150–399)
WBC: 7.9

## 2020-10-18 LAB — FERRITIN: Ferritin: 145 ng/mL (ref 11–307)

## 2020-10-18 LAB — BASIC METABOLIC PANEL
BUN: 15 (ref 4–21)
CO2: 25 — AB (ref 13–22)
Chloride: 106 (ref 99–108)
Creatinine: 0.9 (ref 0.5–1.1)
Glucose: 87
Potassium: 3.8 (ref 3.4–5.3)
Sodium: 139 (ref 137–147)

## 2020-10-18 LAB — COMPREHENSIVE METABOLIC PANEL
Albumin: 4.4 (ref 3.5–5.0)
Calcium: 9.2 (ref 8.7–10.7)

## 2020-10-18 LAB — HEPATIC FUNCTION PANEL
ALT: 23 (ref 7–35)
AST: 32 (ref 13–35)
Alkaline Phosphatase: 104 (ref 25–125)
Bilirubin, Total: 0.4

## 2020-10-18 LAB — CBC: RBC: 4.66 (ref 3.87–5.11)

## 2020-10-18 LAB — IRON AND TIBC
Iron: 101 ug/dL (ref 28–170)
Saturation Ratios: 34 % — ABNORMAL HIGH (ref 10.4–31.8)
TIBC: 297 ug/dL (ref 250–450)
UIBC: 196 ug/dL

## 2020-12-29 ENCOUNTER — Ambulatory Visit (INDEPENDENT_AMBULATORY_CARE_PROVIDER_SITE_OTHER): Payer: Medicaid Other

## 2020-12-29 ENCOUNTER — Other Ambulatory Visit: Payer: Self-pay

## 2020-12-29 ENCOUNTER — Ambulatory Visit (INDEPENDENT_AMBULATORY_CARE_PROVIDER_SITE_OTHER): Payer: Medicaid Other | Admitting: Cardiology

## 2020-12-29 ENCOUNTER — Encounter: Payer: Self-pay | Admitting: Cardiology

## 2020-12-29 VITALS — BP 144/80 | HR 65 | Ht 60.0 in | Wt 180.2 lb

## 2020-12-29 DIAGNOSIS — R0789 Other chest pain: Secondary | ICD-10-CM | POA: Diagnosis not present

## 2020-12-29 DIAGNOSIS — R002 Palpitations: Secondary | ICD-10-CM

## 2020-12-29 DIAGNOSIS — E785 Hyperlipidemia, unspecified: Secondary | ICD-10-CM | POA: Diagnosis not present

## 2020-12-29 DIAGNOSIS — K2270 Barrett's esophagus without dysplasia: Secondary | ICD-10-CM

## 2020-12-29 DIAGNOSIS — I10 Essential (primary) hypertension: Secondary | ICD-10-CM

## 2020-12-29 DIAGNOSIS — K279 Peptic ulcer, site unspecified, unspecified as acute or chronic, without hemorrhage or perforation: Secondary | ICD-10-CM

## 2020-12-29 NOTE — Progress Notes (Signed)
Cardiology Office Note:    Date:  12/29/2020   ID:  Maria Pittman, DOB 1960/08/23, MRN [id number]  PCP:  Dema Severin, NP  Cardiologist:  Gypsy Balsam, MD    Referring MD: Dema Severin, NP   Chief Complaint  Patient presents with   Follow-up  Am doing fine  History of Present Illness:    Maria Pittman is a 60 y.o. female with past medical history significant for atypical chest pain that we investigated at the beginning of this year stress test done was normal.  Also history of dyslipidemia, mixed connective tissue disorder.  She comes today to my office for follow-up cardiac wise seems to be doing well except for the fact that she complain of having palpitations she said that happens about once a week she will feel brief episode of her heart speeding up that lasts only for few seconds there is no shortness of breath no chest pain associated with this sensation.  She is concerned about this and very worried about it.  She was told that she does not have systemic lupus erythematosus.  Recent diagnosis that she got is mixed connective tissue disorder.  She was taking Plaquenil however does have some changes in her eye that medication has been withdrawn in the matter-of-fact yesterday.  She is going to wait for about 3 months to washout this medication and then different probably biological agent will be initiated.  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   Atypical chest pain 07/18/2020   Barrett's esophagus without dysplasia 04/20/2019   Capsulitis of metatarsophalangeal (MTP) joint of right foot 11/10/2019   Dyslipidemia 07/18/2020   Hammer toe of right foot 11/10/2019   Hyperlipidemia    Hypertension    Ingrown nail 02/25/2020   Iron deficiency anemia, unspecified 04/24/2018   Lupus (HCC)    Morton's neuroma of third interspace of right foot 11/10/2019   Other migraine, intractable, without status migrainosus    Peptic ulcer disease 04/20/2019    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 2012 with removal in 2014   CERVICAL ABLATION  2007   CESAREAN SECTION     X3 1988, 1997, 1999   HAND SURGERY Right 08/2019   Thumb at [facility]   ORTHOPEDIC SURGERY Left 1998   Knee   ROTATOR CUFF REPAIR Right 2012   SPLENECTOMY     STOMACH SURGERY     X5 2001-2009    Current Medications: Current Meds  Medication Sig   azithromycin (ZITHROMAX) 250 MG tablet Take 1 tablet by mouth as directed. As instructed per pk   Cholecalciferol 1.25 MG (50000 UT) capsule Take 1 capsule by mouth once a week. Take 1 capsule by mouth once a week for 12 weeks   citalopram (CELEXA) 20 MG tablet Take 20 mg by mouth daily.   fluticasone (FLONASE) 50 MCG/ACT nasal spray Place 1 spray into both nostrils daily.   folic acid (FOLVITE) 1 MG tablet Take 1 mg by mouth daily.   gabapentin (NEURONTIN) 100 MG capsule Take 1 capsule (100 mg total) by mouth 3 (three) times daily. For agitation   losartan (COZAAR) 25 MG tablet Take 25 mg by mouth daily.   montelukast (SINGULAIR) 10 MG tablet Take 10 mg by mouth at bedtime.   Multiple  Vitamins-Minerals (ONE-A-DAY WOMENS PO) Take 1 tablet by mouth daily. Unknown strength per patient   pantoprazole (PROTONIX) 40 MG tablet Take 40 mg by mouth 2 (two) times daily.   promethazine (PHENERGAN) 25 MG tablet Take 25 mg by mouth daily as needed for nausea.   rizatriptan (MAXALT) 10 MG tablet Take 10 mg by mouth as needed for headache. May repeat in 2 hours if needed   sucralfate (CARAFATE) 1 GM/10ML suspension Take 1 g by mouth 3 (three) times daily as needed (Acid reflux).   tiZANidine (ZANAFLEX) 2 MG tablet Take 2 mg by mouth every 8 (eight) hours as needed for muscle spasms.   topiramate (TOPAMAX) 100 MG tablet Take 1 tablet (100 mg total) by mouth  daily. For mood stabilization   triamterene-hydrochlorothiazide (MAXZIDE-25) 37.5-25 MG tablet Take 1 tablet by mouth daily.     Allergies:   Penicillins, Codeine, Nsaids, Benzoin, Latex, and Wound dressing adhesive   Social History   Socioeconomic History   Marital status: Single    Spouse name: Not on file   Number of children: Not on file   Years of education: Not on file   Highest education level: Not on file  Occupational History   Not on file  Tobacco Use   Smoking status: Never   Smokeless tobacco: Never  Vaping Use   Vaping Use: Never used  Substance and Sexual Activity   Alcohol use: Yes   Drug use: Never   Sexual activity: Not Currently  Other Topics Concern   Not on file  Social History Narrative   Not on file   Social Determinants of Health   Financial Resource Strain: Not on file  Food Insecurity: Not on file  Transportation Needs: Not on file  Physical Activity: Not on file  Stress: Not on file  Social Connections: Not on file     Family History: The patient's family history includes Cancer in her mother; Colon cancer in her father; Diabetes in her mother; Heart disease in her father. ROS:   Please see the history of present illness.    All 14 point review of systems negative except as described per history of present illness  EKGs/Labs/Other Studies Reviewed:      Recent Labs: 10/18/2020: ALT 23; BUN 15; Creatinine 0.9; Hemoglobin 14.3; Platelets 358; Potassium 3.8; Sodium 139  Recent Lipid Panel    Component Value Date/Time   CHOL 223 (H) 11/17/2019 0622   TRIG 148 11/17/2019 0622   HDL 89 11/17/2019 0622   CHOLHDL 2.5 11/17/2019 0622   VLDL 30 11/17/2019 0622   LDLCALC 104 (H) 11/17/2019 0622    Physical Exam:    VS:  BP (!) 144/80 (BP Location: Left Arm, Patient Position: Sitting)   Pulse 65   Ht 5' (1.524 m)   Wt 180 lb 3.2 oz (81.7 kg)   SpO2 95%   BMI 35.19 kg/m     Wt Readings from Last 3 Encounters:  12/29/20 180 lb 3.2  oz (81.7 kg)  10/18/20 177 lb 14.4 oz (80.7 kg)  08/30/20 175 lb (79.4 kg)     GEN:  Well nourished, well developed in no acute distress HEENT: Normal NECK: No JVD; No carotid bruits LYMPHATICS: No lymphadenopathy CARDIAC: RRR, no murmurs, no rubs, no gallops RESPIRATORY:  Clear to auscultation without rales, wheezing or rhonchi  ABDOMEN: Soft, non-tender, non-distended MUSCULOSKELETAL:  No edema; No deformity  SKIN: Warm and dry LOWER EXTREMITIES: no swelling NEUROLOGIC:  Alert and oriented x 3 PSYCHIATRIC:  Normal affect   ASSESSMENT:    1. Atypical chest pain   2. Dyslipidemia   3. Primary hypertension   4. Barrett's esophagus without dysplasia   5. Peptic ulcer disease    PLAN:    In order of problems listed above:  Atypical chest pain still have some not related to exercise usually when she lays down in the bed.  Stress test negative symptoms are very atypical I do not think we dealing with coronary artery disease but risk modification still in order. Dyslipidemia she is taking Crestor which I will continue.  We will check her fasting lipid profile. Essential hypertension blood pressure controlled continue present management. History of peptic ulcer disease with Barrett's esophagus.  That being followed by GI team.  Stable Mixed connective tissue disorder followed by rheumatology   Medication Adjustments/Labs and Tests Ordered: Current medicines are reviewed at length with the patient today.  Concerns regarding medicines are outlined above.  No orders of the defined types were placed in this encounter.  Medication changes: No orders of the defined types were placed in this encounter.   Signed, Georgeanna Lea, MD, [facility] 12/29/2020 2:41 PM    [facility]

## 2020-12-29 NOTE — Patient Instructions (Signed)
Medication Instructions:  Your physician recommends that you continue on your current medications as directed. Please refer to the Current Medication list given to you today.  *If you need a refill on your cardiac medications before your next appointment, please call your pharmacy*   Lab Work: Your physician recommends that you return for lab work in: the next few days for lipid check. You need to have labs done when you are fasting.  You can come Monday through Friday 8:30 am to 12:00 pm and 1:15 to 4:30. You do not need to make an appointment as the order has already been placed.   If you have labs (blood work) drawn today and your tests are completely normal, you will receive your results only by: MyChart Message (if you have MyChart) OR A paper copy in the mail If you have any lab test that is abnormal or we need to change your treatment, we will call you to review the results.   Testing/Procedures:  WHY IS MY DOCTOR PRESCRIBING ZIO? The Zio system is proven and trusted by physicians to detect and diagnose irregular heart rhythms -- and has been prescribed to hundreds of thousands of patients.  The FDA has cleared the Zio system to monitor for many different kinds of irregular heart rhythms. In a study, physicians were able to reach a diagnosis 90% of the time with the Zio system1.  You can wear the Zio monitor -- a small, discreet, comfortable patch -- during your normal day-to-day activity, including while you sleep, shower, and exercise, while it records every single heartbeat for analysis.  1Barrett, P., et al. Comparison of 24 Hour Holter Monitoring Versus 14 Day Novel Adhesive Patch Electrocardiographic Monitoring. American Journal of Medicine, 2014.  ZIO VS. HOLTER MONITORING The Zio monitor can be comfortably worn for up to 14 days. Holter monitors can be worn for 24 to 48 hours, limiting the time to record any irregular heart rhythms you may have. Zio is able to capture data  for the 51% of patients who have their first symptom-triggered arrhythmia after 48 hours.1  LIVE WITHOUT RESTRICTIONS The Zio ambulatory cardiac monitor is a small, unobtrusive, and water-resistant patch--you might even forget you're wearing it. The Zio monitor records and stores every beat of your heart, whether you're sleeping, working out, or showering. Wear the monitor for 14 days, remove 01/12/21.   Follow-Up: At [facility], you and your health needs are our priority.  As part of our continuing mission to provide you with exceptional heart care, we have created designated Provider Care Teams.  These Care Teams include your primary Cardiologist (physician) and Advanced Practice Providers (APPs -  Physician Assistants and Nurse Practitioners) who all work together to provide you with the care you need, when you need it.  We recommend signing up for the patient portal called "MyChart".  Sign up information is provided on this After Visit Summary.  MyChart is used to connect with patients for Virtual Visits (Telemedicine).  Patients are able to view lab/test results, encounter notes, upcoming appointments, etc.  Non-urgent messages can be sent to your provider as well.   To learn more about what you can do with MyChart, go to [email or web address].    Your next appointment:   6 month(s)  The format for your next appointment:   In Person  Provider:   Gypsy Balsam, MD   Other Instructions NA

## 2021-01-20 ENCOUNTER — Telehealth: Payer: Self-pay

## 2021-01-20 NOTE — Telephone Encounter (Signed)
-----   Message from Georgeanna Lea, MD sent at 01/20/2021 10:38 AM EDT ----- Monitor showed multiple episode of supraventricular tachycardia, total of 11.  No need to treat unless symptomatic.  We will talk about this during the visit

## 2021-01-20 NOTE — Telephone Encounter (Signed)
Patient notified of results, she stated she is still experiencing chest tightness, message sent to Dr. Kirtland Bouchard for further instructions.

## 2021-01-27 ENCOUNTER — Telehealth: Payer: Self-pay

## 2021-01-27 NOTE — Telephone Encounter (Signed)
Patient confirm still having chest discomfort. Appt set up on 10/17 first available. I advised if sx's worsening to seek medical attention at the ER. Patient verbalized understanding.

## 2021-02-06 ENCOUNTER — Ambulatory Visit: Payer: Medicaid Other | Admitting: Cardiology

## 2021-02-16 ENCOUNTER — Ambulatory Visit: Payer: Medicaid Other | Admitting: Cardiology

## 2021-03-07 ENCOUNTER — Ambulatory Visit (INDEPENDENT_AMBULATORY_CARE_PROVIDER_SITE_OTHER): Payer: Medicaid Other | Admitting: Cardiology

## 2021-03-07 ENCOUNTER — Other Ambulatory Visit: Payer: Self-pay

## 2021-03-07 ENCOUNTER — Encounter: Payer: Self-pay | Admitting: General Practice

## 2021-03-07 VITALS — BP 138/76 | HR 83 | Ht 62.0 in | Wt 183.6 lb

## 2021-03-07 DIAGNOSIS — K2270 Barrett's esophagus without dysplasia: Secondary | ICD-10-CM | POA: Diagnosis not present

## 2021-03-07 DIAGNOSIS — E782 Mixed hyperlipidemia: Secondary | ICD-10-CM

## 2021-03-07 DIAGNOSIS — R0789 Other chest pain: Secondary | ICD-10-CM | POA: Diagnosis not present

## 2021-03-07 NOTE — Progress Notes (Signed)
Cardiology Office Note:    Date:  03/07/2021   ID:  Maria Pittman, DOB [birth year], MRN [id number]  PCP:  Dema Severin, NP  Cardiologist:  Gypsy Balsam, MD    Referring MD: Dema Severin, NP   Chief Complaint  Patient presents with   Chest Pain    Ongoing since 04/2020  I am doing much better  History of Present Illness:    Maria Pittman is a 60 y.o. female with past medical history significant for atypical chest pain which had quite extensive evaluation that included stress test.  Which was negative.  She does have very atypical story for chest pain pain was there all the time.  Interestingly because recently she ended up having second time COVID-19 infection.  She was given Zithromax for that and pain completely subsided she is completely pain-free doing well.  Her past medical history is also significant for mixed connective tissue disease, dyslipidemia.  She used to take Plaquenil however it has been discontinued because of some problem with her eye.  She is off any medication for modifying her disease.  However, she is scheduled to see her rheumatologist within the next month and decision will be made what to do next. Overall she is doing very well she denies have any chest pain tightness squeezing pressure burning chest.  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   Atypical chest pain 07/18/2020   Barrett's esophagus without dysplasia 04/20/2019   Capsulitis of metatarsophalangeal (MTP) joint of right foot 11/10/2019   Dyslipidemia 07/18/2020   Hammer toe of right foot 11/10/2019   Hyperlipidemia    Hypertension    Ingrown nail 02/25/2020   Iron deficiency anemia, unspecified 04/24/2018   Lupus (HCC)    Morton's neuroma of third interspace of right foot 11/10/2019   Other migraine, intractable, without status migrainosus     Peptic ulcer disease 04/20/2019   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 2012 with removal in 2014   CERVICAL ABLATION  2007   CESAREAN SECTION     X3 1988, 1997, 1999   HAND SURGERY Right 08/2019   Thumb at [facility]   ORTHOPEDIC SURGERY Left 1998   Knee   ROTATOR CUFF REPAIR Right 2012   SPLENECTOMY     STOMACH SURGERY     X5 2001-2009    Current Medications: Current Meds  Medication Sig   Cholecalciferol 1.25 MG (50000 UT) capsule Take 1 capsule by mouth once a week. Take 1 capsule by mouth once a week for 12 weeks   citalopram (CELEXA) 20 MG tablet Take 20 mg by mouth daily.   fluticasone (FLONASE) 50 MCG/ACT nasal spray Place 1 spray into both nostrils daily.   folic acid (FOLVITE) 1 MG tablet Take 1 mg by mouth daily.   gabapentin (NEURONTIN) 100 MG capsule Take 1 capsule (100 mg total) by mouth 3 (three) times daily. For agitation   losartan (COZAAR) 25 MG tablet Take 25 mg by mouth daily.   montelukast (SINGULAIR) 10 MG tablet Take 10 mg by mouth at bedtime.   Multiple Vitamins-Minerals (ONE-A-DAY WOMENS PO) Take 1 tablet by mouth daily. Unknown strength per  patient   pantoprazole (PROTONIX) 40 MG tablet Take 40 mg by mouth 2 (two) times daily.   promethazine (PHENERGAN) 25 MG tablet Take 25 mg by mouth daily as needed for nausea.   rizatriptan (MAXALT) 10 MG tablet Take 10 mg by mouth as needed for headache. May repeat in 2 hours if needed   sucralfate (CARAFATE) 1 GM/10ML suspension Take 1 g by mouth 3 (three) times daily as needed (Acid reflux).   tiZANidine (ZANAFLEX) 2 MG tablet Take 2 mg by mouth every 8 (eight) hours as needed for muscle spasms.   topiramate (TOPAMAX) 100 MG tablet Take 1 tablet (100 mg total) by mouth daily. For mood stabilization   triamterene-hydrochlorothiazide  (MAXZIDE-25) 37.5-25 MG tablet Take 1 tablet by mouth daily.     Allergies:   Penicillins, Codeine, Nsaids, Benzoin, Latex, and Wound dressing adhesive   Social History   Socioeconomic History   Marital status: Single    Spouse name: Not on file   Number of children: Not on file   Years of education: Not on file   Highest education level: Not on file  Occupational History   Not on file  Tobacco Use   Smoking status: Never   Smokeless tobacco: Never  Vaping Use   Vaping Use: Never used  Substance and Sexual Activity   Alcohol use: Yes   Drug use: Never   Sexual activity: Not Currently  Other Topics Concern   Not on file  Social History Narrative   Not on file   Social Determinants of Health   Financial Resource Strain: Not on file  Food Insecurity: Not on file  Transportation Needs: Not on file  Physical Activity: Not on file  Stress: Not on file  Social Connections: Not on file     Family History: The patient's family history includes Cancer in her mother; Colon cancer in her father; Diabetes in her mother; Heart disease in her father. ROS:   Please see the history of present illness.    All 14 point review of systems negative except as described per history of present illness  EKGs/Labs/Other Studies Reviewed:      Recent Labs: 10/18/2020: ALT 23; BUN 15; Creatinine 0.9; Hemoglobin 14.3; Platelets 358; Potassium 3.8; Sodium 139  Recent Lipid Panel    Component Value Date/Time   CHOL 223 (H) 11/17/2019 0622   TRIG 148 11/17/2019 0622   HDL 89 11/17/2019 0622   CHOLHDL 2.5 11/17/2019 0622   VLDL 30 11/17/2019 0622   LDLCALC 104 (H) 11/17/2019 0622    Physical Exam:    VS:  BP 138/76 (BP Location: Left Arm, Patient Position: Sitting)   Pulse 83   Ht 5\' 2"  (1.575 m)   Wt 183 lb 9.6 oz (83.3 kg)   SpO2 98%   BMI 33.58 kg/m     Wt Readings from Last 3 Encounters:  03/07/21 183 lb 9.6 oz (83.3 kg)  12/29/20 180 lb 3.2 oz (81.7 kg)  10/18/20 177 lb  14.4 oz (80.7 kg)     GEN:  Well nourished, well developed in no acute distress HEENT: Normal NECK: No JVD; No carotid bruits LYMPHATICS: No lymphadenopathy CARDIAC: RRR, no murmurs, no rubs, no gallops RESPIRATORY:  Clear to auscultation without rales, wheezing or rhonchi  ABDOMEN: Soft, non-tender, non-distended MUSCULOSKELETAL:  No edema; No deformity  SKIN: Warm and dry LOWER EXTREMITIES: no swelling NEUROLOGIC:  Alert and oriented x 3 PSYCHIATRIC:  Normal affect   ASSESSMENT:    1. Atypical  chest pain   2. Mixed hyperlipidemia   3. Barrett's esophagus without dysplasia    PLAN:    In order of problems listed above:  Atypical chest pain gone completely resolved right now.  I think we can drop the issue now.  Stress test was negative. Mixed dyslipidemia: She is being put on rosuvastatin which I will continue.  I did review K PN which show LDL 104 HDL 89 however this is from December of last year she is scheduled next month to see her primary care physician and fasting lipid profile will be done. Barrett's esophagus.  That being followed by GI. Mixed connective tissue disorder that being followed by rheumatology   Medication Adjustments/Labs and Tests Ordered: Current medicines are reviewed at length with the patient today.  Concerns regarding medicines are outlined above.  No orders of the defined types were placed in this encounter.  Medication changes: No orders of the defined types were placed in this encounter.   Signed, Georgeanna Lea, MD, [facility] 03/07/2021 2:57 PM    [facility]

## 2021-03-07 NOTE — Patient Instructions (Signed)

## 2021-05-03 ENCOUNTER — Ambulatory Visit: Payer: Medicaid Other | Admitting: Cardiology

## 2021-09-21 ENCOUNTER — Encounter: Payer: Medicaid Other | Admitting: Obstetrics & Gynecology

## 2021-09-24 ENCOUNTER — Emergency Department (HOSPITAL_COMMUNITY): Payer: Medicaid Other

## 2021-09-24 ENCOUNTER — Encounter (HOSPITAL_COMMUNITY): Payer: Self-pay

## 2021-09-24 ENCOUNTER — Emergency Department (HOSPITAL_COMMUNITY)
Admission: EM | Admit: 2021-09-24 | Discharge: 2021-09-25 | Disposition: A | Discharge status: Home / Self Care | Payer: Medicaid Other | Attending: Emergency Medicine | Admitting: Emergency Medicine

## 2021-09-24 ENCOUNTER — Other Ambulatory Visit: Payer: Self-pay

## 2021-09-24 DIAGNOSIS — Z91040 Latex allergy status: Secondary | ICD-10-CM | POA: Insufficient documentation

## 2021-09-24 DIAGNOSIS — Z79899 Other long term (current) drug therapy: Secondary | ICD-10-CM | POA: Diagnosis not present

## 2021-09-24 DIAGNOSIS — R110 Nausea: Secondary | ICD-10-CM | POA: Insufficient documentation

## 2021-09-24 DIAGNOSIS — R2243 Localized swelling, mass and lump, lower limb, bilateral: Secondary | ICD-10-CM | POA: Diagnosis not present

## 2021-09-24 DIAGNOSIS — R1032 Left lower quadrant pain: Secondary | ICD-10-CM | POA: Diagnosis not present

## 2021-09-24 DIAGNOSIS — R197 Diarrhea, unspecified: Secondary | ICD-10-CM | POA: Diagnosis not present

## 2021-09-24 DIAGNOSIS — R55 Syncope and collapse: Secondary | ICD-10-CM | POA: Diagnosis present

## 2021-09-24 DIAGNOSIS — I10 Essential (primary) hypertension: Secondary | ICD-10-CM | POA: Diagnosis not present

## 2021-09-24 LAB — CBC WITH DIFFERENTIAL/PLATELET
Abs Immature Granulocytes: 0.03 10*3/uL (ref 0.00–0.07)
Basophils Absolute: 0.1 10*3/uL (ref 0.0–0.1)
Basophils Relative: 2 %
Eosinophils Absolute: 0.2 10*3/uL (ref 0.0–0.5)
Eosinophils Relative: 3 %
HCT: 43.4 % (ref 36.0–46.0)
Hemoglobin: 14.8 g/dL (ref 12.0–15.0)
Immature Granulocytes: 1 %
Lymphocytes Relative: 32 %
Lymphs Abs: 2.1 10*3/uL (ref 0.7–4.0)
MCH: 30.3 pg (ref 26.0–34.0)
MCHC: 34.1 g/dL (ref 30.0–36.0)
MCV: 88.9 fL (ref 80.0–100.0)
Monocytes Absolute: 0.7 10*3/uL (ref 0.1–1.0)
Monocytes Relative: 11 %
Neutro Abs: 3.5 10*3/uL (ref 1.7–7.7)
Neutrophils Relative %: 51 %
Platelets: 425 10*3/uL — ABNORMAL HIGH (ref 150–400)
RBC: 4.88 MIL/uL (ref 3.87–5.11)
RDW: 13.8 % (ref 11.5–15.5)
WBC: 6.6 10*3/uL (ref 4.0–10.5)
nRBC: 0 % (ref 0.0–0.2)

## 2021-09-24 LAB — URINALYSIS, ROUTINE W REFLEX MICROSCOPIC
Bilirubin Urine: NEGATIVE
Glucose, UA: 50 mg/dL — AB
Hgb urine dipstick: NEGATIVE
Ketones, ur: 5 mg/dL — AB
Leukocytes,Ua: NEGATIVE
Nitrite: NEGATIVE
Protein, ur: NEGATIVE mg/dL
Specific Gravity, Urine: 1.009 (ref 1.005–1.030)
pH: 8 (ref 5.0–8.0)

## 2021-09-24 LAB — COMPREHENSIVE METABOLIC PANEL
ALT: 18 U/L (ref 0–44)
AST: 25 U/L (ref 15–41)
Albumin: 4 g/dL (ref 3.5–5.0)
Alkaline Phosphatase: 84 U/L (ref 38–126)
Anion gap: 12 (ref 5–15)
BUN: 6 mg/dL (ref 6–20)
CO2: 19 mmol/L — ABNORMAL LOW (ref 22–32)
Calcium: 9 mg/dL (ref 8.9–10.3)
Chloride: 108 mmol/L (ref 98–111)
Creatinine, Ser: 0.74 mg/dL (ref 0.44–1.00)
GFR, Estimated: >60 mL/min (ref >60)
Glucose, Bld: 175 mg/dL — ABNORMAL HIGH (ref 70–99)
Potassium: 3.6 mmol/L (ref 3.5–5.1)
Sodium: 139 mmol/L (ref 135–145)
Total Bilirubin: 0.5 mg/dL (ref 0.3–1.2)
Total Protein: 6.8 g/dL (ref 6.5–8.1)

## 2021-09-24 LAB — TROPONIN I (HIGH SENSITIVITY)
Troponin I (High Sensitivity): 7 ng/L (ref <18)
Troponin I (High Sensitivity): 6 ng/L (ref <18)

## 2021-09-24 LAB — LIPASE, BLOOD: Lipase: 36 U/L (ref 11–51)

## 2021-09-24 MED ORDER — IOHEXOL 300 MG/ML  SOLN
100.0000 mL | Freq: Once | INTRAMUSCULAR | Status: AC | PRN
Start: 2021-09-24 — End: 2021-09-24
  Administered 2021-09-24: 100 mL via INTRAVENOUS

## 2021-09-24 MED ORDER — ONDANSETRON 8 MG PO TBDP: 8.0000 mg | ORAL_TABLET | Freq: Three times a day (TID) | ORAL | 0 refills | Status: DC | PRN

## 2021-09-24 MED ORDER — POLYETHYLENE GLYCOL 3350 17 G PO PACK: 17.0000 g | PACK | Freq: Every day | ORAL | 0 refills | Status: DC

## 2021-09-24 MED ORDER — ONDANSETRON HCL 4 MG/2ML IJ SOLN
4.0000 mg | Freq: Once | INTRAMUSCULAR | Status: AC
  Administered 2021-09-24: 4 mg via INTRAVENOUS
  Filled 2021-09-24: qty 2

## 2021-09-24 NOTE — ED Notes (Signed)
Pt nauseated 

## 2021-09-24 NOTE — Discharge Instructions (Addendum)
Start supplementing MiraLAX into your daily routine.  You can mix this into your coffee or water in the morning, start with a capful and then either add more or less depending until you are fully regularly having bowel movements at least daily or every other day.  Increase amount of fiber in your diet, drink plenty of fluids.  Follow-up with your GI doctor next week for reevaluation, return back to the ED if your symptoms change or worsen.  You can take the Zofran every 8 hours as needed for nausea

## 2021-09-24 NOTE — ED Triage Notes (Signed)
Patient had near syncopal.  EMS reports she was extremely hot to touch but their thermometer read 98.  Patient has extensive abd hx.  EMS reports she has been constipated and unsure if she is possibly septic.

## 2021-09-24 NOTE — ED Notes (Signed)
To ct

## 2021-09-24 NOTE — ED Provider Notes (Signed)
[facility] EMERGENCY DEPARTMENT Provider Note   CSN: [id number] Arrival date & time: 09/24/21  1444     History  Chief Complaint  Patient presents with   Near Syncope   Abdominal Pain    Maria Pittman is a 61 y.o. female.   Near Syncope   Patient with medical history of hypertension, hyperlipidemia, peptic ulcer disease, Barrett's esophagus without dysplasia, depression, atypical chest pain presents today due to left lower quadrant abdominal pain and presyncopal event.  Patient states she has not had a bowel movement in 10 days, she has passed diarrhea without any blood in it.  She is passing gas.  She feels nauseated but has not vomited, she also has left lower quadrant pain which feels like a "stitch in her side".  This is constant does not radiate, it is worse whenever she moves.  Today while she was at work she had an episode where she felt very warm and had a presyncopal event, she denies any chest pain or shortness of breath.  Patient has extensive surgical and abdominal history.  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   Home Medications Prior to Admission medications   Medication Sig Start Date End Date Taking? Authorizing Provider  ondansetron (ZOFRAN-ODT) 8 MG disintegrating tablet Take 1 tablet (8 mg total) by mouth every 8 (eight) hours as needed for nausea or vomiting. 09/24/21  Yes Theron Arista, PA-C  polyethylene glycol (MIRALAX) 17 g packet Take 17 g by mouth daily. 09/24/21  Yes Theron Arista, PA-C  Cholecalciferol 1.25 MG (50000 UT) capsule Take 1 capsule by mouth once a week. Take 1 capsule by mouth once a week for 12 weeks 03/22/20   [provider]  citalopram (CELEXA) 20 MG tablet Take 20 mg by mouth daily. 03/23/20   [provider]  fluticasone (FLONASE) 50 MCG/ACT nasal spray Place 1 spray into both nostrils  daily. 10/15/19   [provider]  folic acid (FOLVITE) 1 MG tablet Take 1 mg by mouth daily. 11/11/19   [provider]  gabapentin (NEURONTIN) 100 MG capsule Take 1 capsule (100 mg total) by mouth 3 (three) times daily. For agitation 11/19/19   Armandina Stammer I, NP  losartan (COZAAR) 25 MG tablet Take 25 mg by mouth daily. 10/08/19   [provider]  montelukast (SINGULAIR) 10 MG tablet Take 10 mg by mouth at bedtime. 08/05/19   [provider]  Multiple Vitamins-Minerals (ONE-A-DAY WOMENS PO) Take 1 tablet by mouth daily. Unknown strength per patient    [provider]  pantoprazole (PROTONIX) 40 MG tablet Take 40 mg by mouth 2 (two) times daily. 11/05/19   [provider]  promethazine (PHENERGAN) 25 MG tablet Take 25 mg by mouth daily as needed for nausea. 04/11/20   [provider]  rizatriptan (MAXALT) 10 MG tablet Take 10 mg by mouth as needed for headache. May repeat in 2 hours if needed    [provider]  rosuvastatin (CRESTOR) 5 MG tablet Take 5 mg by mouth at bedtime. 02/02/21   [provider]  sucralfate (CARAFATE) 1 GM/10ML suspension Take 1 g by mouth 3 (three) times daily as needed (Acid reflux). 06/05/19   [provider]  tiZANidine (ZANAFLEX) 2 MG tablet Take 2 mg by mouth every 8 (eight) hours as needed for muscle spasms. 08/26/19   [provider]  topiramate (TOPAMAX) 100 MG tablet Take 1 tablet (100 mg total) by mouth daily. For mood stabilization 11/19/19   Armandina Stammer I, NP  triamterene-hydrochlorothiazide (MAXZIDE-25) 37.5-25 MG tablet Take 1 tablet by mouth daily. 11/06/19   [provider]      Allergies    Penicillins, Codeine, Nsaids, Benzoin, Latex, and Wound dressing adhesive    Review of Systems   Review of Systems  Cardiovascular:  Positive for near-syncope.   Physical Exam Updated Vital Signs BP (!) 176/83   Pulse 65   Temp 97.9 F (36.6 C)   Resp 17   Ht   (1.575 m)   Wt 83.5 kg   SpO2 100%   BMI 33.65 kg/m  Physical Exam Vitals and nursing note reviewed. Exam conducted with a chaperone present.  Constitutional:      Appearance: Normal appearance. She is obese.     Comments: BMI - 33.65  HENT:     Head: Normocephalic and atraumatic.  Eyes:     General: No scleral icterus.       Right eye: No discharge.        Left eye: No discharge.     Extraocular Movements: Extraocular movements intact.     Pupils: Pupils are equal, round, and reactive to light.  Cardiovascular:     Rate and Rhythm: Normal rate and regular rhythm.     Pulses: Normal pulses.     Heart sounds: Normal heart sounds. No murmur heard.   No friction rub. No gallop.  Pulmonary:     Effort: Pulmonary effort is normal. No respiratory distress.     Breath sounds: Normal breath sounds.  Abdominal:     General: Abdomen is flat. A surgical scar is present. Bowel sounds are normal. There is no distension.     Palpations: Abdomen is soft.     Tenderness: There is abdominal tenderness in the left lower quadrant.  Musculoskeletal:     Right lower leg: Edema present.     Left lower leg: Edema present.     Comments: Trace lower extremity edema bilaterally  Skin:    General: Skin is warm and dry.     Coloration: Skin is not jaundiced.  Neurological:     Mental Status: She is alert. Mental status is at baseline.     Coordination: Coordination normal.    ED Results / Procedures / Treatments   Labs (all labs ordered are listed, but only abnormal results are displayed) Labs Reviewed  CBC WITH DIFFERENTIAL/PLATELET - Abnormal; Notable for the following components:      Result Value   Platelets 425 (*)    All other components within normal limits  COMPREHENSIVE METABOLIC PANEL - Abnormal; Notable for the following components:   CO2 19 (*)    Glucose, Bld 175 (*)    All other components within normal limits  URINALYSIS, ROUTINE W REFLEX MICROSCOPIC - Abnormal; Notable  for the following components:   Color, Urine STRAW (*)    Glucose, UA 50 (*)    Ketones, ur 5 (*)    Bacteria, UA RARE (*)    All other components within normal limits  LIPASE, BLOOD  TROPONIN I (HIGH SENSITIVITY)  TROPONIN I (HIGH SENSITIVITY)    EKG EKG Interpretation  Date/Time:  Sunday September 24 2021 15:07:29 EDT Ventricular Rate:  72 PR Interval:  146 QRS Duration: 75 QT Interval:  437 QTC Calculation: 479 R Axis:   60 Text Interpretation: Sinus rhythm Confirmed by Trifan,  Molli Hazard [phone]) on 09/24/2021 3:08:41 PM  Radiology CT Abdomen Pelvis W Contrast  Result Date: 09/24/2021 CLINICAL DATA:  LLQ abdominal pain.  Nausea and vomiting EXAM: CT ABDOMEN AND PELVIS WITH CONTRAST TECHNIQUE: Multidetector CT imaging of the abdomen and pelvis was performed using the standard protocol following bolus administration of intravenous contrast. RADIATION DOSE REDUCTION: This exam was performed according to the departmental dose-optimization program which includes automated exposure control, adjustment of the mA and/or kV according to patient size and/or use of iterative reconstruction technique. CONTRAST:  OMNIPAQUE IOHEXOL 300 MG/ML  SOLN COMPARISON:  None Available. FINDINGS: Lower chest: Coronary artery calcification.  No acute abnormality. Hepatobiliary: No focal liver abnormality. Calcified gallstone noted within the gallbladder lumen. No gallbladder wall thickening or pericholecystic fluid. No biliary dilatation. Pancreas: No focal lesion. Normal pancreatic contour. No surrounding inflammatory changes. No main pancreatic ductal dilatation. Spleen: Left upper quadrant splenosis. Adrenals/Urinary Tract: No adrenal nodule bilaterally. Bilateral kidneys enhance symmetrically. Subcentimeter hypodensities too small to characterize. No hydronephrosis. No hydroureter. The urinary bladder is unremarkable. 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 Stomach is within normal  limits. No evidence of bowel wall thickening or dilatation. Appendix appears normal. Vascular/Lymphatic: No abdominal aorta or iliac aneurysm. Moderate atherosclerotic plaque of the aorta and its branches. No abdominal, pelvic, or inguinal lymphadenopathy. Reproductive: Uterus and bilateral adnexa are unremarkable. Other: No intraperitoneal free fluid. No intraperitoneal free gas. No organized fluid collection. Musculoskeletal: No abdominal wall hernia or abnormality. No suspicious lytic or blastic osseous lesions. No acute displaced fracture. IMPRESSION: 1. Cholelithiasis with no CT findings of acute cholecystitis. 2. Otherwise no acute intra-abdominal or intrapelvic abnormality in a patient status post Roux-en-Y gastric bypass. 3.  Aortic Atherosclerosis (ICD10-I70.0). Electronically Signed   By: Tish Frederickson M.D.   On: 09/24/2021 16:39   DG Chest Portable 1 View  Result Date: 09/24/2021 CLINICAL DATA:  Presyncope abdominal pain EXAM: PORTABLE CHEST 1 VIEW COMPARISON:  04/06/2020 FINDINGS: Cardiac and mediastinal contours are within normal limits. No focal pulmonary opacity. No pleural effusion or pneumothorax. No acute osseous abnormality. IMPRESSION: No acute cardiopulmonary process. Electronically Signed   By: Wiliam Ke M.D.   On: 09/24/2021 15:32    Procedures Procedures    Medications Ordered in ED Medications  iohexol (OMNIPAQUE) 300 MG/ML solution 100 mL (100 mLs Intravenous Contrast Given 09/24/21 1618)  ondansetron (ZOFRAN) injection 4 mg (4 mg Intravenous Given 09/24/21 1845)    ED Course/ Medical Decision Making/ A&P                           Medical Decision Making Amount and/or Complexity of Data Reviewed Labs: ordered. Radiology: ordered.  Risk Prescription drug management.   This patient presents to the ED for concern of presyncope and left lower quadrant pain, this involves an extensive number of treatment options, and is a complaint that carries with it a high risk of  complications and morbidity.  The differential diagnosis includes but not limited to diverticulitis, SBO, constipation, ACS, arrhythmia, electrolyte derangement, anemia, hypoglycemia PE although less likely given not tachycardic or hypoxic, vasovagal.  Additional history obtained:   Independent historian: Husband Mark at bedside  Reviewed external records including GI visit and cardiology visit.  This advised patient's gastroenterology note regarding surgical history.    Lab Tests:  I ordered, viewed, and personally interpreted labs.  The pertinent results include: Initial troponin negative.  Lipase is within normal limits, no gross electrolyte derangement  or AKI.  UA is without any indication of UTI, CBC without leukocytosis or anemia.    Imaging Studies ordered:  I directly visualized the CXR and CT Abd/pelvis, which showed no acute process  I agree with the radiologist interpretation    ECG/Cardiac monitoring:   Per my interpretation, EKG shows sinus rhythm, no underlying arrhythmia or ischemic findings noted.  No Brugada, no Wellens  The patient was maintained on a cardiac monitor.  Visualized monitor strip which showed NSR 64 bpm per my interpretation.    Medicines ordered and prescription drug management:   I have reviewed the patients home medicines and have made adjustments as needed   Test Considered:  Considered PE but no hypoxia or tachycardia and does not seem consistent with this presentation.  Considered ACS but given no ischemic findings on EKG and negative delta troponin do not think likely.   Reevaluation:  After the interventions noted above, I reevaluated the patient and found patient's symptoms are improving.  I viewed the cardiac monitor, she is in sinus rhythm with a pulse rate of 71.  No hypoxia, repeat abdominal exam is benign without rigidity or guarding.   Problems addressed / ED Course: Presyncope-unclear etiology but low suspicion this point  for emergent cardiac etiology or pulmonary etiology.  Abdominal exam is benign, not a surgical abdomen.   Abdominal pain-I believe it is most likely secondary to constipation.  Discussed bowel routine, MiraLAX and GI follow-up.  CT abdomen is negative without any acute process, serial abdominal exams benign.  Do not think additional work-up indicated at this time.   Social Determinants of Health: Establish care with specialist already established   Disposition:   After consideration of the diagnostic results and the patients response to treatment, I feel that the patent would benefit from close F/U.         Final Clinical Impression(s) / ED Diagnoses Final diagnoses:  Near syncope  Left lower quadrant abdominal pain    Rx / DC Orders ED Discharge Orders          Ordered    ondansetron (ZOFRAN-ODT) 8 MG disintegrating tablet  Every 8 hours PRN        09/24/21 1840    polyethylene glycol (MIRALAX) 17 g packet  Daily        09/24/21 1844              Theron AristaSage, Ioana Louks, PA-C 09/24/21 2259    Terald Sleeperrifan, Matthew J, MD 09/25/21 1134

## 2021-09-27 ENCOUNTER — Encounter (HOSPITAL_COMMUNITY): Payer: Self-pay | Admitting: Emergency Medicine

## 2021-09-27 ENCOUNTER — Emergency Department (HOSPITAL_COMMUNITY): Payer: Medicaid Other

## 2021-09-27 ENCOUNTER — Other Ambulatory Visit: Payer: Self-pay

## 2021-09-27 ENCOUNTER — Emergency Department (HOSPITAL_COMMUNITY)
Admission: EM | Admit: 2021-09-27 | Discharge: 2021-09-27 | Disposition: A | Discharge status: Home / Self Care | Payer: Medicaid Other | Attending: Emergency Medicine | Admitting: Emergency Medicine

## 2021-09-27 DIAGNOSIS — R519 Headache, unspecified: Secondary | ICD-10-CM | POA: Diagnosis not present

## 2021-09-27 DIAGNOSIS — Z91040 Latex allergy status: Secondary | ICD-10-CM | POA: Insufficient documentation

## 2021-09-27 DIAGNOSIS — R55 Syncope and collapse: Secondary | ICD-10-CM | POA: Insufficient documentation

## 2021-09-27 DIAGNOSIS — R1032 Left lower quadrant pain: Secondary | ICD-10-CM | POA: Insufficient documentation

## 2021-09-27 DIAGNOSIS — I10 Essential (primary) hypertension: Secondary | ICD-10-CM | POA: Insufficient documentation

## 2021-09-27 DIAGNOSIS — D75839 Thrombocytosis, unspecified: Secondary | ICD-10-CM | POA: Diagnosis not present

## 2021-09-27 DIAGNOSIS — I951 Orthostatic hypotension: Secondary | ICD-10-CM

## 2021-09-27 DIAGNOSIS — Z79899 Other long term (current) drug therapy: Secondary | ICD-10-CM | POA: Diagnosis not present

## 2021-09-27 DIAGNOSIS — R7989 Other specified abnormal findings of blood chemistry: Secondary | ICD-10-CM | POA: Diagnosis not present

## 2021-09-27 LAB — CBC WITH DIFFERENTIAL/PLATELET
Abs Immature Granulocytes: 0.02 10*3/uL (ref 0.00–0.07)
Basophils Absolute: 0.1 10*3/uL (ref 0.0–0.1)
Basophils Relative: 1 %
Eosinophils Absolute: 0.2 10*3/uL (ref 0.0–0.5)
Eosinophils Relative: 2 %
HCT: 46.3 % — ABNORMAL HIGH (ref 36.0–46.0)
Hemoglobin: 15.3 g/dL — ABNORMAL HIGH (ref 12.0–15.0)
Immature Granulocytes: 0 %
Lymphocytes Relative: 26 %
Lymphs Abs: 2.6 10*3/uL (ref 0.7–4.0)
MCH: 30.1 pg (ref 26.0–34.0)
MCHC: 33 g/dL (ref 30.0–36.0)
MCV: 91.1 fL (ref 80.0–100.0)
Monocytes Absolute: 0.9 10*3/uL (ref 0.1–1.0)
Monocytes Relative: 9 %
Neutro Abs: 6.3 10*3/uL (ref 1.7–7.7)
Neutrophils Relative %: 62 %
Platelets: 405 10*3/uL — ABNORMAL HIGH (ref 150–400)
RBC: 5.08 MIL/uL (ref 3.87–5.11)
RDW: 13.6 % (ref 11.5–15.5)
WBC: 10.1 10*3/uL (ref 4.0–10.5)
nRBC: 0 % (ref 0.0–0.2)

## 2021-09-27 LAB — COMPREHENSIVE METABOLIC PANEL
ALT: 17 U/L (ref 0–44)
AST: 27 U/L (ref 15–41)
Albumin: 3.9 g/dL (ref 3.5–5.0)
Alkaline Phosphatase: 93 U/L (ref 38–126)
Anion gap: 10 (ref 5–15)
BUN: 11 mg/dL (ref 6–20)
CO2: 23 mmol/L (ref 22–32)
Calcium: 9.5 mg/dL (ref 8.9–10.3)
Chloride: 105 mmol/L (ref 98–111)
Creatinine, Ser: 1.07 mg/dL — ABNORMAL HIGH (ref 0.44–1.00)
GFR, Estimated: 59 mL/min — ABNORMAL LOW (ref >60)
Glucose, Bld: 121 mg/dL — ABNORMAL HIGH (ref 70–99)
Potassium: 4 mmol/L (ref 3.5–5.1)
Sodium: 138 mmol/L (ref 135–145)
Total Bilirubin: 0.7 mg/dL (ref 0.3–1.2)
Total Protein: 6.7 g/dL (ref 6.5–8.1)

## 2021-09-27 LAB — MAGNESIUM: Magnesium: 2.2 mg/dL (ref 1.7–2.4)

## 2021-09-27 LAB — TROPONIN I (HIGH SENSITIVITY)
Troponin I (High Sensitivity): 5 ng/L (ref <18)
Troponin I (High Sensitivity): 5 ng/L (ref <18)

## 2021-09-27 LAB — D-DIMER, QUANTITATIVE: D-Dimer, Quant: 0.4 ug/mL-FEU (ref 0.00–0.50)

## 2021-09-27 MED ORDER — SODIUM CHLORIDE 0.9 % IV BOLUS
1000.0000 mL | Freq: Once | INTRAVENOUS | Status: AC
  Administered 2021-09-27: 1000 mL via INTRAVENOUS

## 2021-09-27 NOTE — ED Provider Notes (Signed)
[facility] Provider Note   CSN: [id number] Arrival date & time: 09/27/21  1132     History  Chief Complaint  Patient presents with   Loss of Consciousness    Maria Pittman is a 61 y.o. female.  Patient with h/o nuclear stress test 08/2020 low-risk without signs of ischemia, Holter monitor 12/2020 with several brief episodes of SVT identified but asymptomatic, follows with Dr. Bing MatterKrasowski of cardiology, h/o HTN, s/p Roux-en-Y gastric bypass surgery --presents to the emergency department today for evaluation of syncopal episode. This occurred while Maria Pittman was at her physician's office today.  Patient went for ED follow-up visit.  Maria Pittman has been having ongoing left lower abdominal pain which Maria Pittman relates to adhesions.  Maria Pittman was seen in the emergency department on 09/24/2001 and had a reassuring CT scan and other labs at that time including troponins.  Maria Pittman does report trying to drink fluids, including Gatorade, but does report decreased oral intake.  Maria Pittman reports irritable bowel syndrome and has had constipation and diarrhea back-and-forth.  They were performing orthostatic vital signs at her doctor's office today and when Maria Pittman sat up from a lying position, Maria Pittman began to feel very hot and nauseous.  Slowly her vision closed in and Maria Pittman passed out.  When Maria Pittman came to, Maria Pittman was told that it called the ambulance and that Maria Pittman was going back to the hospital.  Patient denies any preceding or associated chest pain, shortness of breath, or palpitations.  Maria Pittman does have palpitations from time to time.  Maria Pittman also reports several days of a posterior headache.  Maria Pittman does suffer from headaches but feel that they have been more frequent over the past several weeks.  No significant neck pain.  No head injury or neck injury.  No new medications or dosage adjustments except for a new supplement that Maria Pittman has been taking help her sleep at night.  Patient denies risk factors for pulmonary embolism including:  unilateral leg swelling, history of DVT/PE/other blood clots, use of exogenous hormones, recent immobilizations, recent surgery, recent travel (>4hr segment), malignancy, hemoptysis.         Home Medications Prior to Admission medications   Medication Sig Start Date End Date Taking? Authorizing Provider  Cholecalciferol 1.25 MG (50000 UT) capsule Take 1 capsule by mouth once a week. Take 1 capsule by mouth once a week for 12 weeks 03/22/20   [provider]  citalopram (CELEXA) 20 MG tablet Take 20 mg by mouth daily. 03/23/20   [provider]  fluticasone (FLONASE) 50 MCG/ACT nasal spray Place 1 spray into both nostrils daily. 10/15/19   [provider]  folic acid (FOLVITE) 1 MG tablet Take 1 mg by mouth daily. 11/11/19   [provider]  gabapentin (NEURONTIN) 100 MG capsule Take 1 capsule (100 mg total) by mouth 3 (three) times daily. For agitation 11/19/19   Armandina StammerNwoko, Agnes I, NP  losartan (COZAAR) 25 MG tablet Take 25 mg by mouth daily. 10/08/19   [provider]  montelukast (SINGULAIR) 10 MG tablet Take 10 mg by mouth at bedtime. 08/05/19   [provider]  Multiple Vitamins-Minerals (ONE-A-DAY WOMENS PO) Take 1 tablet by mouth daily. Unknown strength per patient    [provider]  ondansetron (ZOFRAN-ODT) 8 MG disintegrating tablet Take 1 tablet (8 mg total) by mouth every 8 (eight) hours as needed for nausea or vomiting. 09/24/21   Theron AristaSage, Haley, PA-C  pantoprazole (PROTONIX) 40 MG tablet Take 40 mg  by mouth 2 (two) times daily. 11/05/19   [provider]  polyethylene glycol (MIRALAX) 17 g packet Take 17 g by mouth daily. 09/24/21   Theron Arista, PA-C  promethazine (PHENERGAN) 25 MG tablet Take 25 mg by mouth daily as needed for nausea. 04/11/20   [provider]  rizatriptan (MAXALT) 10 MG tablet Take 10 mg by mouth as needed for headache. May repeat in 2 hours if needed    [provider]  rosuvastatin  (CRESTOR) 5 MG tablet Take 5 mg by mouth at bedtime. 02/02/21   [provider]  sucralfate (CARAFATE) 1 GM/10ML suspension Take 1 g by mouth 3 (three) times daily as needed (Acid reflux). 06/05/19   [provider]  tiZANidine (ZANAFLEX) 2 MG tablet Take 2 mg by mouth every 8 (eight) hours as needed for muscle spasms. 08/26/19   [provider]  topiramate (TOPAMAX) 100 MG tablet Take 1 tablet (100 mg total) by mouth daily. For mood stabilization 11/19/19   Armandina Stammer I, NP  triamterene-hydrochlorothiazide (MAXZIDE-25) 37.5-25 MG tablet Take 1 tablet by mouth daily. 11/06/19   [provider]      Allergies    Penicillins, Codeine, Nsaids, Benzoin, Latex, and Wound dressing adhesive    Review of Systems   Review of Systems  Physical Exam Updated Vital Signs There were no vitals taken for this visit. Physical Exam Vitals and nursing note reviewed.  Constitutional:      Appearance: Maria Pittman is well-developed. Maria Pittman is not diaphoretic.  HENT:     Head: Normocephalic and atraumatic.     Right Ear: External ear normal.     Left Ear: External ear normal.     Nose: Nose normal.     Mouth/Throat:     Mouth: Mucous membranes are moist. Mucous membranes are not dry.  Eyes:     Conjunctiva/sclera: Conjunctivae normal.  Neck:     Vascular: Normal carotid pulses. No carotid bruit or JVD.     Trachea: Trachea normal. No tracheal deviation.     Comments: No carotid bruits heard. Cardiovascular:     Rate and Rhythm: Normal rate and regular rhythm.     Pulses: No decreased pulses.          Radial pulses are 2+ on the right side and 2+ on the left side.     Heart sounds: Normal heart sounds, S1 normal and S2 normal. No murmur heard. Pulmonary:     Effort: Pulmonary effort is normal. No respiratory distress.     Breath sounds: No wheezing.  Chest:     Chest wall: No tenderness.  Abdominal:     General: Bowel sounds are normal.     Palpations: Abdomen is soft.      Tenderness: There is no abdominal tenderness. There is no guarding or rebound.  Musculoskeletal:        General: Normal range of motion.     Cervical back: Normal range of motion and neck supple. No muscular tenderness.  Skin:    General: Skin is warm and dry.     Coloration: Skin is not pale.  Neurological:     Mental Status: Maria Pittman is alert.    ED Results / Procedures / Treatments   Labs (all labs ordered are listed, but only abnormal results are displayed) Labs Reviewed - No data to display  EKG None  Radiology No results found.  Procedures Procedures    Medications Ordered in ED Medications - No data to display  ED Course/ Medical Decision Making/ A&P    Patient seen and examined. History obtained directly from patient as well as previous ED visit records and family member at bedside who gives additional details in regards to patient's symptoms.  I also reviewed imaging and lab results from previous ED visit.  We will plan to broaden work-up today.  Her history does strongly suggest an orthostatic component of syncope and Maria Pittman does report recent poor oral intake, so we will give IV hydration.  Labs/EKG: Ordered CBC, CMP, troponin, D-dimer.  Imaging: Ordered head CT.  Medications/Fluids: IV fluids  Most recent vital signs reviewed and are as follows: SpO2 98%   Initial impression: Syncope, no history of heart problems.  1:33 PM Reassessment performed. Patient appears stable.  Patient's husband now at bedside.  He provides additional history in regards to several days of word finding difficulties as well as an appropriate word uses in her sentences.  For this reason, MRI brain ordered to evaluate for the potential of stroke.  Patient husband states that PCP was also concerned about a TIA.  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  Imaging personally visualized and interpreted including: CT of the head, agree negative.  Reviewed pertinent lab work and imaging with patient at bedside. Questions answered.   Most current vital signs reviewed and are as follows: BP 140/64   Pulse (!) 56   Resp 13   SpO2 100%   Plan: Awaiting D-dimer, MRI brain ordered to evaluate for possibility of stroke.  1:51 PM D-dimer interpreted normal, 0.40.   2:37 PM MRI personally reviewed and interpreted.  Agree no acute findings.    3:36 PM Reassessment performed. Patient appears stable.  Maria Pittman has been up to the bathroom on her feet without much difficulty.  Currently pending second troponin.  Reviewed telemetry without any concerning episodes while in ED today.   Reviewed pertinent lab work and imaging with patient at bedside. Questions answered.   Most current vital signs reviewed and are as follows: BP 130/63   Pulse 60   Temp 98.1 F (36.7 C) (Oral)   Resp 15   Ht 5\' 2"  (1.575 m)   Wt 83.5 kg   SpO2 100%   BMI 33.65 kg/m   Plan: Signout to PA-C at shift change.  Pending second troponin.  If second troponin is negative, plan for discharge to home.  We discussed strict return precautions.  He is involving strokelike symptoms or worsening chest pain or shortness of breath.  Also, Maria Pittman should return if Maria Pittman has additional episodes of passing out.  Otherwise rest and maintain good hydration at home.                           Medical Decision Making  Patient with syncopal episode today, orthostatic component.  Also suspect mild dehydration.  First troponin today was reassuring and no concerning features on EKG including heart block, WPW, Brugada syndrome, prolonged QT, or other abnormalities.  Patient has abnormal headache and was evaluated with CT head and MRI which were both reassuring.  Maria Pittman was screened for blood clot  with D-dimer.  This was negative.  Patient has had recent abdominal pain leading to poor oral intake, however this was evaluated at  previous visit with reassuring CT scan of the abdomen pelvis, and this has not appreciably worsened.  Patient does have appropriate PCP and cardiology follow-up.  The patient's vital signs, pertinent lab work and imaging were reviewed and interpreted as discussed in the ED course. Hospitalization was considered for further testing, treatments, or serial exams/observation. However as patient is well-appearing, has a stable exam, and reassuring studies today, I do not feel that they warrant admission at this time. This plan was discussed with the patient who verbalizes agreement and comfort with this plan and seems reliable and able to return to the Emergency Department with worsening or changing symptoms.             Final Clinical Impression(s) / ED Diagnoses Final diagnoses:  Orthostatic syncope  Acute nonintractable headache, unspecified headache type    Rx / DC Orders ED Discharge Orders     None         Renne Crigler, PA-C 09/27/21 1539    Gloris Manchester, MD 09/27/21 [phone]

## 2021-09-27 NOTE — ED Notes (Signed)
Pt ambulated to bathroom with staff assistance for safety. Assisted back to room via wheelchair due to increased dizziness with ambulation.

## 2021-09-27 NOTE — Discharge Instructions (Addendum)
Please read and follow all provided instructions.  Your diagnoses today include:  1. Orthostatic syncope   2. Acute nonintractable headache, unspecified headache type     Tests performed today include: CT of your head which was normal and did not show any serious cause of your headache MRI of the brain: does not show any stroke Complete blood cell count: minimally high red blood cell count Basic metabolic panel: minimally weak kidney function Cardiac enzymes (blood test looking for stress on the heart): no signs of heart attack EKG: normal heart rhythm D-dimer: screening test for blood clot was negative Vital signs. See below for your results today.   Medications:  None  Take any prescribed medications only as directed.  Additional information:  Follow any educational materials contained in this packet.  BE VERY CAREFUL not to take multiple medicines containing Tylenol (also called acetaminophen). Doing so can lead to an overdose which can damage your liver and cause liver failure and possibly death.   Follow-up instructions: Please follow-up with your primary care provider in the next 3 days for further evaluation of your symptoms.   Return instructions:  Please return to the Emergency Department if you experience worsening symptoms. Return if the medications do not resolve your headache, if it recurs, or if you have multiple episodes of vomiting or cannot keep down fluids. Return if you have a change from the usual headache. RETURN IMMEDIATELY IF you: Develop a sudden, severe headache Develop confusion or become poorly responsive or faint Develop a fever above 100.12F or problem breathing Have a change in speech, vision, swallowing, or understanding Develop new weakness, numbness, tingling, incoordination in your arms or legs Have a seizure Please return if you have any other emergent concerns.  Additional Information:  Your vital signs today were: BP (!) 145/71    Pulse (!) 58   Resp 15   SpO2 99%  If your blood pressure (BP) was elevated above 135/85 this visit, please have this repeated by your doctor within one month. --------------

## 2021-09-27 NOTE — ED Notes (Signed)
Patient transported to MRI 

## 2021-09-27 NOTE — ED Provider Notes (Signed)
Patient's care assumed by me at 330 from [facility].  Patient was having orthostatic vital signs done at her primary care physician's office and she had a syncopal episode.  And has had a CT scan of her head and an MRI which showed no acute changes.  Patient's discharge is pending second troponin. Second troponin is normal.  Patient reevaluated and is stable for discharge she is advised to follow-up with her physician for recheck return if any problems   Maria Pittman 09/27/21 1638    Charlesetta Shanks, MD 10/01/21 [phone]

## 2021-09-27 NOTE — ED Notes (Signed)
Patient verbalizes understanding of discharge instructions. Opportunity for questioning and answers were provided. Armband removed by staff, pt discharged from ED. Pt taken to ED waiting room via wheel chair.  

## 2021-09-27 NOTE — ED Triage Notes (Signed)
Pt arrived from doctor's office via [facility]  coming from doc office in [place] had a noticable fall in the dr office  LOC for a few seconds  second episode since sunday  last seen here at [facility] on Sunday pt has extensive abdominal hx   After episode she states she had tingling in feet, hands, and lips  was at the scene AOx4  C/O dizziness and weakness  NSR  140/90  HR 60  98% R  108 CBG  20 RAC   States before episode she gets super dizzy, then hot, then passes out

## 2021-10-03 ENCOUNTER — Telehealth: Payer: Self-pay | Admitting: Cardiology

## 2021-10-03 DIAGNOSIS — R55 Syncope and collapse: Secondary | ICD-10-CM

## 2021-10-03 NOTE — Telephone Encounter (Signed)
Spoke with pt who states that she has had 2 episodes of syncope and both times has been seen at [facility] ED. Pt will monitor her BP laying, sitting and standing for a baseline of orthostatics even when no symptoms. How soon does she need to be worked in and does she need a monitor?

## 2021-10-03 NOTE — Telephone Encounter (Signed)
Pt c/o Syncope: STAT if syncope occurred within 30 minutes and pt complains of lightheadedness High Priority if episode of passing out, completely, today or in last 24 hours   Did you pass out today? no  When is the last time you passed out? 4th and 7th both times [facility], took ekgs, also did a chest x ray  Has this occurred multiple times? twice  Did you have any symptoms prior to passing out? Got hot and dizzy and nauseas.   Patient states she has passed out twice. She states she was taken to [facility] hospital and they did ekgs and chest x rays. She says the first time [place] sitting down.  She says the 2nd time she was in her PCP's office

## 2021-10-03 NOTE — Addendum Note (Signed)
Addended by: Eleonore Chiquito on: 10/03/2021 05:01 PM   Modules accepted: Orders

## 2021-10-03 NOTE — Telephone Encounter (Signed)
Recommendations reviewed with pt as per Dr. Julien Nordmann note.  Pt verbalized understanding and had no additional questions. Will apply monitor at time of echo.

## 2021-10-05 ENCOUNTER — Ambulatory Visit (INDEPENDENT_AMBULATORY_CARE_PROVIDER_SITE_OTHER): Payer: Medicaid Other

## 2021-10-05 DIAGNOSIS — I5030 Unspecified diastolic (congestive) heart failure: Secondary | ICD-10-CM

## 2021-10-05 DIAGNOSIS — R55 Syncope and collapse: Secondary | ICD-10-CM | POA: Diagnosis not present

## 2021-10-05 LAB — ECHOCARDIOGRAM COMPLETE
Area-P 1/2: 2.16 cm2
S' Lateral: 2.2 cm

## 2021-10-06 ENCOUNTER — Telehealth: Payer: Self-pay

## 2021-10-06 NOTE — Telephone Encounter (Signed)
Results reviewed with pt as per Dr. Revankar's note.  Pt verbalized understanding and had no additional questions. Routed to PCP.  

## 2021-10-16 ENCOUNTER — Telehealth: Payer: Self-pay

## 2021-11-08 ENCOUNTER — Encounter: Payer: Self-pay | Admitting: *Deleted

## 2021-11-09 ENCOUNTER — Encounter: Payer: Self-pay | Admitting: Psychiatry

## 2021-11-09 ENCOUNTER — Telehealth: Payer: Self-pay | Admitting: *Deleted

## 2021-11-09 ENCOUNTER — Ambulatory Visit: Payer: Medicaid Other | Admitting: Psychiatry

## 2021-11-09 VITALS — Ht 61.0 in | Wt 181.0 lb

## 2021-11-09 DIAGNOSIS — G43119 Migraine with aura, intractable, without status migrainosus: Secondary | ICD-10-CM

## 2021-11-09 DIAGNOSIS — R55 Syncope and collapse: Secondary | ICD-10-CM

## 2021-11-09 MED ORDER — AJOVY 225 MG/1.5ML ~~LOC~~ SOAJ
225.0000 mg | SUBCUTANEOUS | 6 refills | Status: DC
Start: 2021-11-09 — End: 2021-11-10

## 2021-11-09 MED ORDER — ZOLMITRIPTAN 5 MG PO TABS: 5.0000 mg | ORAL_TABLET | ORAL | 6 refills | Status: DC | PRN

## 2021-11-09 NOTE — Telephone Encounter (Signed)
Ajovy denied\. Per your health plan's criteria, this drug is covered if you meet the following: If the request is for a non-preferred drug, you have failed two preferred drugs: Aimovig and Emgality.

## 2021-11-09 NOTE — Patient Instructions (Signed)
Start Ajovy for migraine prevention  Start zolmitriptan for migraine rescue. Take at the onset of migraine. If headache recurs or does not fully resolve, you may take a second dose after 2 hours. Please avoid taking more than 2 days per week to prevent rebound headaches

## 2021-11-09 NOTE — Telephone Encounter (Signed)
Zolmitriptan denied. Per your health plan's criteria, this drug is covered if you meet the following: For a non-preferred drug, you have tried one preferred drug: sumatriptan. Sent to MD.
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
## 2021-11-09 NOTE — Telephone Encounter (Signed)
Received e mail, documents attached. Your information has been sent to [facility].  

## 2021-11-09 NOTE — Telephone Encounter (Signed)
Zolmitriptan 5 mg PA, Key: [id number]. Your information has been sent to [employer].

## 2021-11-09 NOTE — Telephone Encounter (Signed)
[place] PA, Key: [id number], faxed office notes to be attached.

## 2021-11-10 ENCOUNTER — Other Ambulatory Visit: Payer: Self-pay | Admitting: Psychiatry

## 2021-11-10 MED ORDER — EMGALITY 120 MG/ML ~~LOC~~ SOAJ
2.0000 | Freq: Once | SUBCUTANEOUS | 0 refills | Status: AC
Start: 2021-11-10 — End: 2021-11-10

## 2021-11-10 MED ORDER — SUMATRIPTAN SUCCINATE 50 MG PO TABS: 50.0000 mg | ORAL_TABLET | ORAL | 6 refills | Status: DC | PRN

## 2021-11-10 MED ORDER — EMGALITY 120 MG/ML ~~LOC~~ SOAJ: 1.0000 | SUBCUTANEOUS | 6 refills | Status: DC

## 2021-11-10 NOTE — Telephone Encounter (Signed)
Rx changed to sumatriptan, thanks

## 2021-11-10 NOTE — Telephone Encounter (Signed)
Rx for emgality sent, thanks

## 2021-11-13 ENCOUNTER — Other Ambulatory Visit: Payer: Self-pay | Admitting: Psychiatry

## 2021-11-13 ENCOUNTER — Telehealth: Payer: Self-pay | Admitting: *Deleted

## 2021-11-13 MED ORDER — EMGALITY 120 MG/ML ~~LOC~~ SOAJ: 2.0000 | Freq: Once | SUBCUTANEOUS | 0 refills | Status: AC

## 2021-11-13 NOTE — Telephone Encounter (Signed)
PA for emgality has been approved. From 11/13/2021-12/14/2021.

## 2021-11-13 NOTE — Telephone Encounter (Signed)
She needs a loading dose. I sent an rx Friday but it expired. I'll send a new rx today

## 2021-11-13 NOTE — Telephone Encounter (Signed)
Called [facility] /[facility] prescriber PA line , for Elaine PA, spoke with [facility] who advised notes be Faxed to [phone], ref- [id number]. Answered clinical questions, faxed notes. Received confirmation.

## 2021-11-23 ENCOUNTER — Telehealth: Payer: Self-pay

## 2021-11-23 DIAGNOSIS — I4729 Other ventricular tachycardia: Secondary | ICD-10-CM

## 2021-11-23 DIAGNOSIS — R55 Syncope and collapse: Secondary | ICD-10-CM

## 2021-11-23 NOTE — Telephone Encounter (Signed)
Called [employer], spoke with Fleet Contras and informed her zolmitriptan PA not needed. Dr Delena Bali ordered sumatriptan which is preferred. She stated she sees a paid claim for sumatriptan.

## 2021-11-23 NOTE — Telephone Encounter (Signed)
Spoke with rachel at [facility] and advised her PA not needed on zolmitriptan. MD ordered sumatriptan. She stated she sees a paid claim for it last month.

## 2021-11-23 NOTE — Telephone Encounter (Signed)
Instructions reviewed with pt and sent via MyChart.  Your physician has requested that you have a lexiscan myoview. For further information please visit [email or web address] Please follow instruction sheet, as given.  The test will take approximately 3 to 4 hours to complete; you may bring reading material.  If someone comes with you to your appointment, they will need to remain in the main lobby due to limited space in the testing area. **If you are pregnant or breastfeeding, please notify the nuclear lab prior to your appointment**  How to prepare for your Myocardial Perfusion Test: Do not eat or drink 3 hours prior to your test, except you may have water. Do not consume products containing caffeine (regular or decaffeinated) 12 hours prior to your test. (ex: coffee, chocolate, sodas, tea). Do bring a list of your current medications with you.  If not listed below, you may take your medications as normal. Do wear comfortable clothes (no dresses or overalls) and walking shoes, tennis shoes preferred (No heels or open toe shoes are allowed). Do NOT wear cologne, perfume, aftershave, or lotions (deodorant is allowed). If these instructions are not followed, your test will have to be rescheduled.

## 2021-11-23 NOTE — Telephone Encounter (Signed)
Called [employer] spoke with Fleet Contras and advised her Ajovy PA not needed. Dr Delena Bali ordered Emgality which was approved. She stated pharmacy ran it through for 2 pens for one day. Need to call pharmacy to have them run Rx correctly. Called [facility], spoke with Vincenza Hews and advised him. He stated Rx went through for $4, they will order 2nd pen, should come in tomorrow afternoon . Called patient, advised her of emgality to be ready for her tomorrow afternoon. Patient verbalized understanding, appreciation.

## 2021-11-23 NOTE — Telephone Encounter (Signed)
-----   Message from Garwin Brothers, MD sent at 11/22/2021  4:56 PM EDT ----- I want her to have a exercise stress Cardiolite.  Copy primary Garwin Brothers, MD 11/22/2021 4:56 PM

## 2021-11-23 NOTE — Telephone Encounter (Signed)
[place] Therapist, occupational) Further information needed for Ajovy PA to be submitted. Contact info: Provider Services, [phone]

## 2021-11-23 NOTE — Telephone Encounter (Signed)
[employer] Teacher, English as a foreign language) Further information needed for Zolmitriptan PA to be submitted. Contact info: Provider Services, [phone]

## 2021-11-28 ENCOUNTER — Telehealth: Payer: Self-pay | Admitting: *Deleted

## 2021-11-28 NOTE — Telephone Encounter (Signed)
Left message on voicemail per DPR in reference to upcoming appointment scheduled on 12/05/21 at 0800 with detailed instructions given per Myocardial Perfusion Study Information Sheet for the test. LM to arrive 15 minutes early, and that it is imperative to arrive on time for appointment to keep from having the test rescheduled. If you need to cancel or reschedule your appointment, please call the office within 24 hours of your appointment. Failure to do so may result in a cancellation of your appointment, and a $50 no show fee. Phone number given for call back for any questions. Jamison Soward, Adelene Idler

## 2021-11-30 ENCOUNTER — Telehealth: Payer: Self-pay | Admitting: Cardiology

## 2021-11-30 NOTE — Telephone Encounter (Signed)
Calling in regards to appeal for Authorization. She would like a callback. Please advise

## 2021-11-30 NOTE — Telephone Encounter (Signed)
LM to return my call. 

## 2021-11-30 NOTE — Telephone Encounter (Signed)
Patient returned CMA's call. 

## 2021-12-04 NOTE — Telephone Encounter (Signed)
Unable to reach the patient, I called [facility] and LM to return my call.

## 2021-12-11 NOTE — Telephone Encounter (Signed)
Unable to reach or LM.

## 2022-02-12 ENCOUNTER — Encounter: Payer: Self-pay | Admitting: Adult Health
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
## 2022-02-13 ENCOUNTER — Ambulatory Visit: Payer: Medicaid Other | Admitting: Adult Health

## 2022-02-26 ENCOUNTER — Other Ambulatory Visit: Payer: Self-pay | Admitting: Psychiatry

## 2022-03-06 ENCOUNTER — Ambulatory Visit: Payer: Medicaid Other | Admitting: Cardiology

## 2022-03-12 ENCOUNTER — Telehealth: Payer: Self-pay

## 2022-03-12 NOTE — Telephone Encounter (Signed)
This request has received a Favorable outcome. Approved through 03/13/2023.

## 2022-03-12 NOTE — Telephone Encounter (Signed)
I spoke with the patient who stated she is still doing well on Emgality 120 MG/ML auto-injector. A PA has been started on [employer]. (Key: [id number]). The [employer] is reviewing your PA request. Typically an electronic response will be received within 24-72 hours.
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
## 2022-03-29 ENCOUNTER — Ambulatory Visit: Payer: Medicaid Other | Admitting: Adult Health

## 2022-03-29 ENCOUNTER — Encounter: Payer: Self-pay | Admitting: Adult Health

## 2022-03-29 VITALS — BP 143/73 | HR 78 | Ht 61.0 in | Wt 175.0 lb

## 2022-03-29 DIAGNOSIS — R55 Syncope and collapse: Secondary | ICD-10-CM | POA: Diagnosis not present

## 2022-03-29 DIAGNOSIS — G43119 Migraine with aura, intractable, without status migrainosus: Secondary | ICD-10-CM

## 2022-03-29 DIAGNOSIS — R413 Other amnesia: Secondary | ICD-10-CM

## 2022-03-29 DIAGNOSIS — E538 Deficiency of other specified B group vitamins: Secondary | ICD-10-CM | POA: Diagnosis not present

## 2022-03-29 MED ORDER — AJOVY 225 MG/1.5ML ~~LOC~~ SOAJ: 225.0000 mg | SUBCUTANEOUS | 11 refills | Status: DC

## 2022-03-29 MED ORDER — NURTEC 75 MG PO TBDP: 75.0000 mg | ORAL_TABLET | Freq: Every day | ORAL | 0 refills | Status: DC | PRN

## 2022-03-29 NOTE — Patient Instructions (Addendum)
Your Plan:  Would recommend trialing Nurtec for rescue medication   Recommend switching to Ajovy monthly injection   Will check B12 and thyroid levels today     Follow up in 3 months or call earlier if needed       Thank you for coming to see [place] at [facility]. I hope we have been able to provide you high quality care today.  You may receive a patient satisfaction survey over the next few weeks. We would appreciate your feedback and comments so that we may continue to improve ourselves and the health of our patients.

## 2022-03-30 LAB — TSH: TSH: 1.74 u[IU]/mL (ref 0.450–4.500)

## 2022-03-30 LAB — VITAMIN B12: Vitamin B-12: 391 pg/mL (ref 232–1245)

## 2022-04-03 NOTE — Telephone Encounter (Signed)
New PA for Ajovy submitted, Key: [id number] Your information has been sent to [employer].

## 2022-04-04 ENCOUNTER — Other Ambulatory Visit: Payer: Self-pay | Admitting: Family Medicine

## 2022-04-04 DIAGNOSIS — Z1231 Encounter for screening mammogram for malignant neoplasm of breast: Secondary | ICD-10-CM

## 2022-04-13 ENCOUNTER — Inpatient Hospital Stay: Admission: RE | Admit: 2022-04-13 | Payer: Medicaid Other | Source: Ambulatory Visit

## 2022-04-26 NOTE — Telephone Encounter (Signed)
Ajovy was denied, will attempt appeal   Here are the policy requirements your request did not meet: Per your health plan's criteria, this drug is covered if you meet the following: You have tried one of the following drugs for at least one month: (A) Antidepressants (for example, amitriptyline, venlafaxine). (B) Beta blockers (for example, propranolol, metoprolol, timolol, atenolol). (C) Angiotensin converting enzyme inhibitors/angiotensin II receptor blockers (for example, lisinopril, candesartan). (D) Calcium channel blockers (for example, verapamil, nimodipine). The information provided does not show that you meet the criteria listed above. Please speak with your doctor about your choices. This decision was made per the [place] [facility] - CGRP Guideline

## 2022-04-26 NOTE — Telephone Encounter (Signed)
Reasons for denial was not asked in [place]. I have submitted appeal. Faxed back to [phone]. Confirmation received.

## 2022-04-29 ENCOUNTER — Other Ambulatory Visit: Payer: Self-pay | Admitting: Adult Health

## 2022-04-30 MED ORDER — NURTEC 75 MG PO TBDP: 75.0000 mg | ORAL_TABLET | Freq: Every day | ORAL | 0 refills | Status: DC | PRN

## 2022-05-17 ENCOUNTER — Ambulatory Visit: Payer: Medicaid Other | Attending: Cardiology | Admitting: Cardiology

## 2022-05-17 ENCOUNTER — Encounter: Payer: Self-pay | Admitting: Cardiology

## 2022-05-17 VITALS — BP 132/74 | HR 76 | Ht 62.0 in | Wt 170.4 lb

## 2022-05-17 DIAGNOSIS — R002 Palpitations: Secondary | ICD-10-CM

## 2022-05-17 DIAGNOSIS — I10 Essential (primary) hypertension: Secondary | ICD-10-CM

## 2022-05-17 DIAGNOSIS — R0789 Other chest pain: Secondary | ICD-10-CM

## 2022-05-17 DIAGNOSIS — E785 Hyperlipidemia, unspecified: Secondary | ICD-10-CM | POA: Diagnosis not present

## 2022-05-17 NOTE — Patient Instructions (Addendum)
Medication Instructions:  Your physician recommends that you continue on your current medications as directed. Please refer to the Current Medication list given to you today.  *If you need a refill on your cardiac medications before your next appointment, please call your pharmacy*   Lab Work: Your physician recommends that you return for lab work in: when fasting You need to have labs done when you are fasting.  You can come Monday through Friday 8:30 am to 12:00 pm and 1:15 to 4:30. You do not need to make an appointment as the order has already been placed. The labs you are going to have done are Lipids.    Testing/Procedures: None Ordered   Follow-Up: At [facility], you and your health needs are our priority.  As part of our continuing mission to provide you with exceptional heart care, we have created designated Provider Care Teams.  These Care Teams include your primary Cardiologist (physician) and Advanced Practice Providers (APPs -  Physician Assistants and Nurse Practitioners) who all work together to provide you with the care you need, when you need it.  We recommend signing up for the patient portal called "MyChart".  Sign up information is provided on this After Visit Summary.  MyChart is used to connect with patients for Virtual Visits (Telemedicine).  Patients are able to view lab/test results, encounter notes, upcoming appointments, etc.  Non-urgent messages can be sent to your provider as well.   To learn more about what you can do with MyChart, go to [email or web address].    Your next appointment:   AS Needed  The format for your next appointment:   In Person  Provider:   Jenne Campus, MD    Other Instructions NA
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
## 2022-05-18 NOTE — Telephone Encounter (Signed)
Pt called. Stated her insurance denied her again for the D'[place] and wants to know what can be done now.

## 2022-05-21 ENCOUNTER — Other Ambulatory Visit: Payer: Self-pay

## 2022-05-21 MED ORDER — AJOVY 225 MG/1.5ML ~~LOC~~ SOAJ: 225.0000 mg | SUBCUTANEOUS | 11 refills | Status: DC

## 2022-05-21 NOTE — Telephone Encounter (Signed)
That is odd they denied Ajovy when Emgality was approved before but difficulty tolerating. Reviewed prior telephone notes re: prior denial of Ajovy and recommended failure of both Emgality and Aimovig prior to approval of Ajovy. We can see if insurance will approve Aimovig unless hx of constipation then this would be contraindicated. If not hx or issues with constipation, can place order for Aimovig to see if this will be approved. Thank you.

## 2022-05-21 NOTE — Telephone Encounter (Signed)
I will call insurance to get reason for denial before proceeding

## 2022-05-21 NOTE — Telephone Encounter (Signed)
Insurance has denied Maria Pittman again, I have not received denial letter yet. What do you recommend as next step?

## 2022-05-21 NOTE — Telephone Encounter (Signed)
[employer], spoke to [employer]. She stated Ajovy was actually Approved - 07/27/22. Patient notified and order placed.

## 2022-05-23 ENCOUNTER — Telehealth: Payer: Self-pay | Admitting: Adult Health

## 2022-05-23 NOTE — Telephone Encounter (Signed)
Attempted PA for nurtec on C[employer]/ [employer] NOB:[id number] Awaiting determination

## 2022-05-23 NOTE — Telephone Encounter (Signed)
Pt called stating that her insurance has approved the Ajovy but not the Nurtec. Pt would like to know if she can go back to the Rizatriptan and if so can it be called [facility] to [facility].

## 2022-05-23 NOTE — Telephone Encounter (Signed)
I do not see where nurtec has been attempted through the patient's insurance yet. I will attempt a PA and see if we can get it authorized first.

## 2022-05-24 MED ORDER — NURTEC 75 MG PO TBDP: 75.0000 mg | ORAL_TABLET | ORAL | 5 refills | Status: DC | PRN

## 2022-05-24 NOTE — Telephone Encounter (Signed)
Called pt. VM is full and couldn't leave message per DPR.

## 2022-05-24 NOTE — Telephone Encounter (Signed)
Approved. Will send a updated script to the pharmacy for the patient and she should be able to now get it covered to fill it.   [place] [facility] [place] has reviewed the request for [employer] Tab 75mg  Odt submitted by Maria Pittman on behalf of Maria Pittman on 05/23/2022. After review, the request for service is: Approved through 05/24/2023

## 2022-05-24 NOTE — Addendum Note (Signed)
Addended by: Darleen Crocker on: 05/24/2022 08:09 AM   Modules accepted: Orders
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
## 2022-07-10 ENCOUNTER — Telehealth: Payer: Self-pay | Admitting: Adult Health

## 2022-07-10 ENCOUNTER — Ambulatory Visit: Payer: Medicaid Other | Admitting: Adult Health

## 2022-07-25 ENCOUNTER — Encounter: Payer: Self-pay | Admitting: Neurology

## 2022-11-21 ENCOUNTER — Other Ambulatory Visit: Payer: Self-pay | Admitting: Cardiology

## 2022-11-21 DIAGNOSIS — I4729 Other ventricular tachycardia: Secondary | ICD-10-CM
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
## 2022-11-22 ENCOUNTER — Ambulatory Visit: Payer: Medicaid Other | Admitting: Neurology

## 2022-11-22 ENCOUNTER — Encounter: Payer: Self-pay | Admitting: Neurology
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
## 2023-01-14 ENCOUNTER — Ambulatory Visit: Payer: Medicaid Other | Admitting: Adult Health

## 2023-01-14 ENCOUNTER — Encounter: Payer: Self-pay | Admitting: Adult Health

## 2023-06-04 ENCOUNTER — Other Ambulatory Visit: Payer: Self-pay | Admitting: Adult Health

## 2023-06-04 NOTE — Telephone Encounter (Signed)
Last seen on 03/29/22 No follow up scheduled  Rx denied needs updated visit.

## 2023-06-12 ENCOUNTER — Telehealth: Payer: Self-pay

## 2023-06-12 ENCOUNTER — Other Ambulatory Visit (HOSPITAL_COMMUNITY): Payer: Self-pay

## 2023-06-12 NOTE — Telephone Encounter (Signed)
*  [facility]  Pharmacy Patient Advocate Encounter   Received notification from CoverMyMeds that prior authorization for Nurtec 75MG  dispersible tablets  is required/requested.   Insurance verification completed.   The patient is insured through [facility] .   Per test claim: PA required; However, NEW/RECENT labs/notes are needed to complete & submit PA request. Please see below.  Patient last seen 03/2022-nurtec script has now expired.    CMM Key: [id number]

## 2023-06-19 NOTE — Telephone Encounter (Signed)
 Canceling request. Nothing further needed.

## 2023-12-13 ENCOUNTER — Emergency Department (HOSPITAL_BASED_OUTPATIENT_CLINIC_OR_DEPARTMENT_OTHER)

## 2023-12-13 ENCOUNTER — Emergency Department (HOSPITAL_BASED_OUTPATIENT_CLINIC_OR_DEPARTMENT_OTHER)
Admission: EM | Admit: 2023-12-13 | Discharge: 2023-12-13 | Disposition: A | Discharge status: Home / Self Care | Attending: Emergency Medicine | Admitting: Emergency Medicine

## 2023-12-13 ENCOUNTER — Encounter (HOSPITAL_BASED_OUTPATIENT_CLINIC_OR_DEPARTMENT_OTHER): Payer: Self-pay

## 2023-12-13 ENCOUNTER — Other Ambulatory Visit: Payer: Self-pay

## 2023-12-13 DIAGNOSIS — W19XXXA Unspecified fall, initial encounter: Secondary | ICD-10-CM | POA: Insufficient documentation

## 2023-12-13 DIAGNOSIS — M5412 Radiculopathy, cervical region: Secondary | ICD-10-CM | POA: Diagnosis not present

## 2023-12-13 DIAGNOSIS — R55 Syncope and collapse: Secondary | ICD-10-CM | POA: Diagnosis not present

## 2023-12-13 DIAGNOSIS — S0990XA Unspecified injury of head, initial encounter: Secondary | ICD-10-CM | POA: Insufficient documentation

## 2023-12-13 DIAGNOSIS — Z79899 Other long term (current) drug therapy: Secondary | ICD-10-CM | POA: Insufficient documentation

## 2023-12-13 DIAGNOSIS — I10 Essential (primary) hypertension: Secondary | ICD-10-CM | POA: Insufficient documentation

## 2023-12-13 DIAGNOSIS — Z91040 Latex allergy status: Secondary | ICD-10-CM | POA: Insufficient documentation

## 2023-12-13 LAB — URINALYSIS, ROUTINE W REFLEX MICROSCOPIC
Bilirubin Urine: NEGATIVE
Glucose, UA: NEGATIVE mg/dL
Hgb urine dipstick: NEGATIVE
Ketones, ur: NEGATIVE mg/dL
Leukocytes,Ua: NEGATIVE
Nitrite: NEGATIVE
Protein, ur: NEGATIVE mg/dL
Specific Gravity, Urine: 1.007 (ref 1.005–1.030)
pH: 6 (ref 5.0–8.0)

## 2023-12-13 LAB — CBC
HCT: 41.9 % (ref 36.0–46.0)
Hemoglobin: 13.9 g/dL (ref 12.0–15.0)
MCH: 29.1 pg (ref 26.0–34.0)
MCHC: 33.2 g/dL (ref 30.0–36.0)
MCV: 87.7 fL (ref 80.0–100.0)
Platelets: 414 K/uL — ABNORMAL HIGH (ref 150–400)
RBC: 4.78 MIL/uL (ref 3.87–5.11)
RDW: 15.6 % — ABNORMAL HIGH (ref 11.5–15.5)
WBC: 6.6 K/uL (ref 4.0–10.5)
nRBC: 0 % (ref 0.0–0.2)

## 2023-12-13 LAB — COMPREHENSIVE METABOLIC PANEL WITH GFR
ALT: 15 U/L (ref 0–44)
AST: 25 U/L (ref 15–41)
Albumin: 4.3 g/dL (ref 3.5–5.0)
Alkaline Phosphatase: 103 U/L (ref 38–126)
Anion gap: 13 (ref 5–15)
BUN: 10 mg/dL (ref 8–23)
CO2: 19 mmol/L — ABNORMAL LOW (ref 22–32)
Calcium: 9.4 mg/dL (ref 8.9–10.3)
Chloride: 104 mmol/L (ref 98–111)
Creatinine, Ser: 0.86 mg/dL (ref 0.44–1.00)
GFR, Estimated: >60 mL/min (ref >60)
Glucose, Bld: 198 mg/dL — ABNORMAL HIGH (ref 70–99)
Potassium: 3.6 mmol/L (ref 3.5–5.1)
Sodium: 136 mmol/L (ref 135–145)
Total Bilirubin: 0.5 mg/dL (ref 0.0–1.2)
Total Protein: 6.8 g/dL (ref 6.5–8.1)

## 2023-12-13 LAB — CBG MONITORING, ED: Glucose-Capillary: 159 mg/dL — ABNORMAL HIGH (ref 70–99)

## 2023-12-13 MED ORDER — ACETAMINOPHEN 500 MG PO TABS
1000.0000 mg | ORAL_TABLET | Freq: Once | ORAL | Status: AC
  Administered 2023-12-13: 1000 mg via ORAL
  Filled 2023-12-13: qty 2

## 2023-12-13 MED ORDER — IOHEXOL 350 MG/ML SOLN
80.0000 mL | Freq: Once | INTRAVENOUS | Status: AC | PRN
  Administered 2023-12-13: 80 mL via INTRAVENOUS

## 2023-12-13 NOTE — ED Triage Notes (Signed)
 Patient arrives POV with complaints of repeat falls, head/neck pain going down her right arm, and intermittent elevated BP and feeling off balance. Rates her pain an 8/10. Sent here by her PCP for further eval.  Hx of neck stenosis.

## 2023-12-13 NOTE — ED Provider Notes (Signed)
 [place] EMERGENCY DEPARTMENT AT [facility] Provider Note   CSN: [id number] Arrival date & time: 12/13/23  1313     Patient presents with: Fall, Hypertension, and Loss of Consciousness   Maria Pittman is a 63 y.o. female.   Patient to ED for evaluation of symptoms of unsteadiness, positional dizziness and multiple falls since 2 weeks ago when she had a syncopal episode. At that time, she woke up and got out of bed to manage leg cramps. While standing she passed out, fell forward striking her face against the door. She had significant facial bruising, headache and neck pain but did not seek medical attention. She contacted her doctor today regarding persistent symptoms and frequent falling and was directed to the ED for further evaluation.    Fall  Hypertension  Loss of Consciousness      Prior to Admission medications   Medication Sig Start Date End Date Taking? Authorizing Provider  citalopram  (CELEXA ) 20 MG tablet Take 20 mg by mouth daily. 03/23/20   [provider]  cyanocobalamin (VITAMIN B12) 1000 MCG/ML injection Inject 1 mL into the muscle every 30 (thirty) days.    [provider]  fluticasone  (FLONASE ) 50 MCG/ACT nasal spray Place 1 spray into both nostrils daily. 10/15/19   [provider]  folic acid  (FOLVITE ) 1 MG tablet Take 1 mg by mouth daily. 11/11/19   [provider]  Fremanezumab -vfrm (AJOVY ) 225 MG/1.5ML SOAJ Inject 225 mg into the skin every 30 (thirty) days. 05/21/22   Whitfield Raisin, NP  gabapentin  (NEURONTIN ) 100 MG capsule Take 1 capsule (100 mg total) by mouth 3 (three) times daily. For agitation 11/19/19   Collene Gouge I, NP  hydrOXYzine  (ATARAX ) 50 MG tablet Take 50-100 mg by mouth at bedtime.  12/13/23  [provider]  losartan  (COZAAR ) 25 MG tablet Take 25 mg by mouth daily. 10/08/19   [provider]  montelukast  (SINGULAIR ) 10 MG tablet Take 10 mg by mouth at bedtime. 08/05/19   [provider]  Multiple Vitamins-Minerals (ONE-A-DAY WOMENS PO) Take 1 tablet by mouth daily. Unknown strength per patient    [provider]  pantoprazole  (PROTONIX ) 40 MG tablet Take 40 mg by mouth 2 (two) times daily. 11/05/19   [provider]  polyethylene glycol (MIRALAX ) 17 g packet Take 17 g by mouth daily. 09/24/21   Emelia Sluder, PA-C  promethazine (PHENERGAN) 25 MG tablet Take 25 mg by mouth daily as needed for nausea. 04/11/20   [provider]  Rimegepant Sulfate (NURTEC) 75 MG TBDP Take 1 tablet (75 mg total) by mouth as needed (Take at onse of migraine). 05/24/22   Whitfield Raisin, NP  rosuvastatin (CRESTOR) 5 MG tablet Take 5 mg by mouth at bedtime. 02/02/21   [provider]  Semaglutide (OZEMPIC, 0.25 OR 0.5 MG/DOSE, [place]) Inject 0.25 mg into the skin once a week.    [provider]  tiZANidine  (ZANAFLEX ) 2 MG tablet Take 2 mg by mouth every 8 (eight) hours as needed for muscle spasms. 08/26/19   [provider]  topiramate  (TOPAMAX ) 100 MG tablet Take 1 tablet (100 mg total) by mouth daily. For mood stabilization 11/19/19   Collene Gouge I, NP  triamterene -hydrochlorothiazide  (MAXZIDE -25) 37.5-25 MG tablet Take 1 tablet by mouth daily. 11/06/19   [provider]    Allergies: Penicillins, Codeine, Nsaids, Benzoin, Latex, and Wound dressing adhesive    Review of Systems  Cardiovascular:  Positive for syncope.    Updated  Vital Signs BP (!) 154/73   Pulse 69   Temp 98.4 F (36.9 C) (Oral)   Resp 18   Ht 5' 1 (1.549 m)   Wt 60.8 kg   SpO2 97%   BMI 25.32 kg/m   Physical Exam Vitals and nursing note reviewed.  Constitutional:      Appearance: She is well-developed.  HENT:     Head: Normocephalic.  Cardiovascular:     Rate and Rhythm: Normal rate and regular rhythm.     Heart sounds: No murmur heard. Pulmonary:     Effort: Pulmonary effort is normal.     Breath sounds: Normal breath sounds. No wheezing, rhonchi  or rales.  Abdominal:     General: Bowel sounds are normal.     Palpations: Abdomen is soft.     Tenderness: There is no abdominal tenderness. There is no guarding or rebound.  Musculoskeletal:        General: Normal range of motion.     Cervical back: Normal range of motion and neck supple.  Skin:    General: Skin is warm and dry.  Neurological:     General: No focal deficit present.     Mental Status: She is alert and oriented to person, place, and time.     GCS: GCS eye subscore is 4. GCS verbal subscore is 5. GCS motor subscore is 6.     Cranial Nerves: Cranial nerves 2-12 are intact. No dysarthria or facial asymmetry.     Sensory: Sensory deficit present.     Motor: No weakness, abnormal muscle tone or pronator drift.     Coordination: Coordination is intact. Finger-Nose-Finger Test normal.     Gait: Gait normal.     Comments: Slightly decreased sensation to light touch to right upper extremity.      (all labs ordered are listed, but only abnormal results are displayed) Labs Reviewed  COMPREHENSIVE METABOLIC PANEL WITH GFR - Abnormal; Notable for the following components:      Result Value   CO2 19 (*)    Glucose, Bld 198 (*)    All other components within normal limits  CBC - Abnormal; Notable for the following components:   RDW 15.6 (*)    Platelets 414 (*)    All other components within normal limits  URINALYSIS, ROUTINE W REFLEX MICROSCOPIC - Abnormal; Notable for the following components:   Color, Urine COLORLESS (*)    All other components within normal limits  CBG MONITORING, ED - Abnormal; Notable for the following components:   Glucose-Capillary 159 (*)    All other components within normal limits   Results for orders placed or performed during the hospital encounter of 12/13/23  CBG monitoring, ED   Collection Time: 12/13/23  1:32 PM  Result Value Ref Range   Glucose-Capillary 159 (H) 70 - 99 mg/dL  Comprehensive metabolic panel   Collection Time:  12/13/23  1:35 PM  Result Value Ref Range   Sodium 136 135 - 145 mmol/L   Potassium 3.6 3.5 - 5.1 mmol/L   Chloride 104 98 - 111 mmol/L   CO2 19 (L) 22 - 32 mmol/L   Glucose, Bld 198 (H) 70 - 99 mg/dL   BUN 10 8 - 23 mg/dL   Creatinine, Ser 9.13 0.44 - 1.00 mg/dL   Calcium 9.4 8.9 - 89.6 mg/dL   Total Protein 6.8 6.5 - 8.1 g/dL   Albumin 4.3 3.5 - 5.0 g/dL   AST 25 15 - 41 U/L  ALT 15 0 - 44 U/L   Alkaline Phosphatase 103 38 - 126 U/L   Total Bilirubin 0.5 0.0 - 1.2 mg/dL   GFR, Estimated >39 >39 mL/min   Anion gap 13 5 - 15  CBC   Collection Time: 12/13/23  1:35 PM  Result Value Ref Range   WBC 6.6 4.0 - 10.5 K/uL   RBC 4.78 3.87 - 5.11 MIL/uL   Hemoglobin 13.9 12.0 - 15.0 g/dL   HCT 58.0 63.9 - 53.9 %   MCV 87.7 80.0 - 100.0 fL   MCH 29.1 26.0 - 34.0 pg   MCHC 33.2 30.0 - 36.0 g/dL   RDW 84.3 (H) 88.4 - 84.4 %   Platelets 414 (H) 150 - 400 K/uL   nRBC 0.0 0.0 - 0.2 %  Urinalysis, Routine w reflex microscopic -Urine, Clean Catch   Collection Time: 12/13/23  2:46 PM  Result Value Ref Range   Color, Urine COLORLESS (A) YELLOW   APPearance CLEAR CLEAR   Specific Gravity, Urine 1.007 1.005 - 1.030   pH 6.0 5.0 - 8.0   Glucose, UA NEGATIVE NEGATIVE mg/dL   Hgb urine dipstick NEGATIVE NEGATIVE   Bilirubin Urine NEGATIVE NEGATIVE   Ketones, ur NEGATIVE NEGATIVE mg/dL   Protein, ur NEGATIVE NEGATIVE mg/dL   Nitrite NEGATIVE NEGATIVE   Leukocytes,Ua NEGATIVE NEGATIVE     EKG: EKG Interpretation Date/Time:  Friday December 13 2023 13:25:05 EDT Ventricular Rate:  79 PR Interval:  132 QRS Duration:  78 QT Interval:  394 QTC Calculation: 451 R Axis:   50  Text Interpretation: Sinus rhythm with Premature atrial complexes Otherwise normal ECG When compared with ECG of 27-Sep-2021 11:38, PREVIOUS ECG IS PRESENT Confirmed by Mannie Pac [phone]) on 12/13/2023 5:51:45 PM  Radiology: No results found.   Procedures   Medications Ordered in the ED  iohexol  (OMNIPAQUE )  350 MG/ML injection 80 mL (80 mLs Intravenous Contrast Given 12/13/23 1755)    Clinical Course as of 12/13/23 1910  Fri Dec 13, 2023  1904 Patient to ED by PCP for evaluation of recent syncopal episode with head and neck injury. The event happened 2 weeks ago and since she has had persistent positional dizziness, neck pain with right radicular symptoms (h/o cervical stenosis she thinks at C4) and gait imbalance causing multiple falls. No chest pain. Labs without significant abnormalities - no cardiac event, no infection. EKG sinus rhythm with PAC's, otherwise normal EKG. Discussed with Dr. Mannie. CTA head and neck ordered to fully evaluate symptoms.  [SU]  1909 CTA pending at end of shift. Patient care signed out the Dr. Mannie for appropriate disposition.  [SU]    Clinical Course User Index [SU] Odell Balls, PA-C                                 Medical Decision Making Amount and/or Complexity of Data Reviewed Labs: ordered. Radiology: ordered.  Risk Prescription drug management.        Final diagnoses:  Injury of head, initial encounter  Syncope, unspecified syncope type  Cervical radiculopathy    ED Discharge Orders     None          Odell Balls, PA-C 12/13/23 1910    Mannie Pac T, DO 12/15/23 1521

## 2023-12-13 NOTE — ED Provider Notes (Addendum)
 Patient here today for unsteadiness, positional dizziness, multiple falls.  Sent to ED by her PCP.  Patient hoping to get MRI, unfortunately we do not have MRI available at this hour.  Her CTA head and neck did not show acute process, left-sided ophthalmologic aneurysm.  I offered to transfer the patient to [facility] to have MRI done, however the patient does not want to go to the emergency room there.  She tells me that she has an autoimmune disease, and does not like being around germs.  She also says that she just wants to go home so she can take her medications.  She is going to follow-up with her primary care doctor to have outpatient MRI done.   Mannie Fairy DASEN, DO 12/13/23 2009    Mannie Fairy T, DO 12/13/23 2010    Mannie Fairy T, DO 12/13/23 2011

## 2023-12-13 NOTE — Discharge Instructions (Addendum)
 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  This is likely not contributing to your symptoms today.  I do recommend following up with your primary care doctor to have an MRI ordered.  Return to the emergency room if your symptoms worsen.

## 2023-12-19 ENCOUNTER — Other Ambulatory Visit (HOSPITAL_BASED_OUTPATIENT_CLINIC_OR_DEPARTMENT_OTHER): Payer: Self-pay | Admitting: Nurse Practitioner

## 2023-12-19 DIAGNOSIS — M4802 Spinal stenosis, cervical region: Secondary | ICD-10-CM

## 2023-12-19 DIAGNOSIS — M5412 Radiculopathy, cervical region: Secondary | ICD-10-CM

## 2023-12-27 ENCOUNTER — Other Ambulatory Visit (HOSPITAL_BASED_OUTPATIENT_CLINIC_OR_DEPARTMENT_OTHER): Payer: Self-pay | Admitting: Nurse Practitioner

## 2023-12-27 DIAGNOSIS — I671 Cerebral aneurysm, nonruptured: Secondary | ICD-10-CM

## 2023-12-30 ENCOUNTER — Other Ambulatory Visit (HOSPITAL_BASED_OUTPATIENT_CLINIC_OR_DEPARTMENT_OTHER): Payer: Self-pay | Admitting: Nurse Practitioner

## 2023-12-30 DIAGNOSIS — I671 Cerebral aneurysm, nonruptured: Secondary | ICD-10-CM

## 2024-01-02 ENCOUNTER — Other Ambulatory Visit (HOSPITAL_BASED_OUTPATIENT_CLINIC_OR_DEPARTMENT_OTHER): Admitting: Radiology

## 2024-01-05 ENCOUNTER — Ambulatory Visit (HOSPITAL_BASED_OUTPATIENT_CLINIC_OR_DEPARTMENT_OTHER)

## 2024-01-07 ENCOUNTER — Ambulatory Visit (HOSPITAL_BASED_OUTPATIENT_CLINIC_OR_DEPARTMENT_OTHER)
Admission: RE | Admit: 2024-01-07 | Discharge: 2024-01-07 | Disposition: A | Discharge status: Home / Self Care | Source: Ambulatory Visit | Attending: Nurse Practitioner | Admitting: Nurse Practitioner

## 2024-01-07 DIAGNOSIS — M5412 Radiculopathy, cervical region: Secondary | ICD-10-CM | POA: Diagnosis present

## 2024-01-07 DIAGNOSIS — M4802 Spinal stenosis, cervical region: Secondary | ICD-10-CM

## 2024-01-07 DIAGNOSIS — I671 Cerebral aneurysm, nonruptured: Secondary | ICD-10-CM | POA: Insufficient documentation

## 2024-01-07 MED ORDER — GADOBUTROL 1 MMOL/ML IV SOLN
6.0000 mL | Freq: Once | INTRAVENOUS | Status: AC | PRN
  Administered 2024-01-07: 6 mL via INTRAVENOUS

## 2024-01-08 ENCOUNTER — Other Ambulatory Visit (HOSPITAL_BASED_OUTPATIENT_CLINIC_OR_DEPARTMENT_OTHER): Admitting: Radiology

## 2024-01-14 ENCOUNTER — Other Ambulatory Visit: Payer: Self-pay | Admitting: Neurological Surgery

## 2024-01-16 ENCOUNTER — Inpatient Hospital Stay (HOSPITAL_BASED_OUTPATIENT_CLINIC_OR_DEPARTMENT_OTHER): Admission: RE | Admit: 2024-01-16 | Source: Ambulatory Visit | Admitting: Radiology

## 2024-01-16 NOTE — Progress Notes (Signed)
 Surgical Instructions   Your procedure is scheduled on Monday, September 29th. Report to [facility] Main Entrance A at 12:20 P.M., then check in with the Admitting office. Any questions or running late day of surgery: call [phone]  Questions prior to your surgery date: call [phone], Monday-Friday, 8am-4pm. If you experience any cold or flu symptoms such as cough, fever, chills, shortness of breath, etc. between now and your scheduled surgery, please notify us  at the above number.     Remember:  Do not eat or drink after midnight the night before your surgery   Take these medicines the morning of surgery with A SIP OF WATER  cetirizine (ZYRTEC)  cyclobenzaprine (FLEXERIL)  gabapentin  (NEURONTIN )  pantoprazole  (PROTONIX )  topiramate  (TOPAMAX )   May take these medicines IF NEEDED: acetaminophen  (TYLENOL )  cyclobenzaprine (FLEXERIL)  promethazine (PHENERGAN)    One week prior to surgery, STOP taking any Aspirin (unless otherwise instructed by your surgeon) Aleve, Naproxen, Ibuprofen, Motrin, Advil, Goody's, BC's, all herbal medications, fish oil, and non-prescription vitamins.  As of today, HOLD your Semaglutide, [facility]) medication until after surgery.                    Do NOT Smoke (Tobacco/Vaping) for 24 hours prior to your procedure.  If you use a CPAP at night, you may bring your mask/headgear for your overnight stay.   You will be asked to remove any contacts, glasses, piercing's, hearing aid's, dentures/partials prior to surgery. Please bring cases for these items if needed.    Patients discharged the day of surgery will not be allowed to drive home, and someone needs to stay with them for 24 hours.  SURGICAL WAITING ROOM VISITATION Patients may have no more than 2 support people in the waiting area - these visitors may rotate.   Pre-op nurse will coordinate an appropriate time for 1 ADULT support person, who may not rotate, to accompany patient in pre-op.   Children under the age of 58 must have an adult with them who is not the patient and must remain in the main waiting area with an adult.  If the patient needs to stay at the hospital during part of their recovery, the visitor guidelines for inpatient rooms apply.  Please refer to the [facility] website for the visitor guidelines for any additional information.   If you received a COVID test during your pre-op visit  it is requested that you wear a mask when out in public, stay away from anyone that may not be feeling well and notify your surgeon if you develop symptoms. If you have been in contact with anyone that has tested positive in the last 10 days please notify you surgeon.      Pre-operative 5 CHG Bathing Instructions   You can play a key role in reducing the risk of infection after surgery. Your skin needs to be as free of germs as possible. You can reduce the number of germs on your skin by washing with CHG (chlorhexidine gluconate) soap before surgery. CHG is an antiseptic soap that kills germs and continues to kill germs even after washing.   DO NOT use if you have an allergy to chlorhexidine/CHG or antibacterial soaps. If your skin becomes reddened or irritated, stop using the CHG and notify one of our RNs at [phone].   Please shower with the CHG soap starting 4 days before surgery using the following schedule:     Please keep in mind the following:  DO  NOT shave, including legs and underarms, starting the day of your first shower.   Place clean sheets on your bed the day you start using CHG soap. Use a clean washcloth (not used since being washed) for each shower. DO NOT sleep with pets once you start using the CHG.   CHG Shower Instructions:  Wash your face and private area with normal soap. If you choose to wash your hair, wash first with your normal shampoo.  After you use shampoo/soap, rinse your hair and body thoroughly to remove shampoo/soap residue.  Turn  the water OFF and apply about 3 tablespoons (45 ml) of CHG soap to a CLEAN washcloth.  Apply CHG soap ONLY FROM YOUR NECK DOWN TO YOUR TOES (washing for 3-5 minutes)  DO NOT use CHG soap on face, private areas, open wounds, or sores.  Pay special attention to the area where your surgery is being performed.  If you are having back surgery, having someone wash your back for you may be helpful. Wait 2 minutes after CHG soap is applied, then you may rinse off the CHG soap.  Pat dry with a clean towel  Put on clean clothes/pajamas   If you choose to wear lotion, please use ONLY the CHG-compatible lotions that are listed below.  Additional instructions for the day of surgery: DO NOT APPLY any lotions, deodorants, powders, or perfumes.   Do not bring valuables to the hospital. [facility] is not responsible for any belongings/valuables. Do not wear nail polish, gel polish, artificial nails, or any other type of covering on natural nails (fingers and toes) Do not wear jewelry or makeup Put on clean/comfortable clothes.  Please brush your teeth.  Ask your nurse before applying any prescription medications to the skin.     CHG Compatible Lotions   Aveeno Moisturizing lotion  Cetaphil Moisturizing Cream  Cetaphil Moisturizing Lotion  Clairol Herbal Essence Moisturizing Lotion, Dry Skin  Clairol Herbal Essence Moisturizing Lotion, Extra Dry Skin  Clairol Herbal Essence Moisturizing Lotion, Normal Skin  Curel Age Defying Therapeutic Moisturizing Lotion with Alpha Hydroxy  Curel Extreme Care Body Lotion  Curel Soothing Hands Moisturizing Hand Lotion  Curel Therapeutic Moisturizing Cream, Fragrance-Free  Curel Therapeutic Moisturizing Lotion, Fragrance-Free  Curel Therapeutic Moisturizing Lotion, Original Formula  Eucerin Daily Replenishing Lotion  Eucerin Dry Skin Therapy Plus Alpha Hydroxy Crme  Eucerin Dry Skin Therapy Plus Alpha Hydroxy Lotion  Eucerin Original Crme  Eucerin Original  Lotion  Eucerin Plus Crme Eucerin Plus Lotion  Eucerin TriLipid Replenishing Lotion  Keri Anti-Bacterial Hand Lotion  Keri Deep Conditioning Original Lotion Dry Skin Formula Softly Scented  Keri Deep Conditioning Original Lotion, Fragrance Free Sensitive Skin Formula  Keri Lotion Fast Absorbing Fragrance Free Sensitive Skin Formula  Keri Lotion Fast Absorbing Softly Scented Dry Skin Formula  Keri Original Lotion  Keri Skin Renewal Lotion Keri Silky Smooth Lotion  Keri Silky Smooth Sensitive Skin Lotion  Nivea Body Creamy Conditioning Oil  Nivea Body Extra Enriched Lotion  Nivea Body Original Lotion  Nivea Body Sheer Moisturizing Lotion Nivea Crme  Nivea Skin Firming Lotion  NutraDerm 30 Skin Lotion  NutraDerm Skin Lotion  NutraDerm Therapeutic Skin Cream  NutraDerm Therapeutic Skin Lotion  ProShield Protective Hand Cream  Provon moisturizing lotion  Please read over the following fact sheets that you were given.

## 2024-01-17 ENCOUNTER — Other Ambulatory Visit: Payer: Self-pay

## 2024-01-17 ENCOUNTER — Encounter (HOSPITAL_COMMUNITY)
Admission: RE | Admit: 2024-01-17 | Discharge: 2024-01-17 | Disposition: A | Discharge status: Home / Self Care | Source: Ambulatory Visit | Attending: Neurological Surgery | Admitting: Neurological Surgery

## 2024-01-17 ENCOUNTER — Encounter (HOSPITAL_COMMUNITY): Payer: Self-pay

## 2024-01-17 VITALS — BP 140/75 | HR 76 | Temp 98.1°F | Resp 17 | Ht 61.0 in | Wt 127.8 lb

## 2024-01-17 DIAGNOSIS — Z01812 Encounter for preprocedural laboratory examination: Secondary | ICD-10-CM | POA: Insufficient documentation

## 2024-01-17 DIAGNOSIS — Z01818 Encounter for other preprocedural examination: Secondary | ICD-10-CM

## 2024-01-17 HISTORY — DX: Type 2 diabetes mellitus without complications: E11.9

## 2024-01-17 LAB — SURGICAL PCR SCREEN
MRSA, PCR: NEGATIVE
Staphylococcus aureus: NEGATIVE

## 2024-01-17 LAB — CBC
HCT: 42 % (ref 36.0–46.0)
Hemoglobin: 14.2 g/dL (ref 12.0–15.0)
MCH: 29.1 pg (ref 26.0–34.0)
MCHC: 33.8 g/dL (ref 30.0–36.0)
MCV: 86.1 fL (ref 80.0–100.0)
Platelets: 400 K/uL (ref 150–400)
RBC: 4.88 MIL/uL (ref 3.87–5.11)
RDW: 13.9 % (ref 11.5–15.5)
WBC: 6.1 K/uL (ref 4.0–10.5)
nRBC: 0 % (ref 0.0–0.2)

## 2024-01-17 LAB — TYPE AND SCREEN
ABO/RH(D): O POS
Antibody Screen: NEGATIVE

## 2024-01-17 LAB — BASIC METABOLIC PANEL WITH GFR
Anion gap: 10 (ref 5–15)
BUN: <5 mg/dL — ABNORMAL LOW (ref 8–23)
CO2: 22 mmol/L (ref 22–32)
Calcium: 9.2 mg/dL (ref 8.9–10.3)
Chloride: 102 mmol/L (ref 98–111)
Creatinine, Ser: 0.71 mg/dL (ref 0.44–1.00)
GFR, Estimated: >60 mL/min (ref >60)
Glucose, Bld: 79 mg/dL (ref 70–99)
Potassium: 4 mmol/L (ref 3.5–5.1)
Sodium: 134 mmol/L — ABNORMAL LOW (ref 135–145)

## 2024-01-17 LAB — GLUCOSE, CAPILLARY: Glucose-Capillary: 83 mg/dL (ref 70–99)

## 2024-01-17 LAB — PROTIME-INR
INR: 0.9 (ref 0.8–1.2)
Prothrombin Time: 12.8 s (ref 11.4–15.2)

## 2024-01-17 NOTE — Progress Notes (Signed)
 PCP - Dr. Burnard Rigg Cardiologist - Dr. Lamar Fitch LOV 05-17-22 & follow up as needed  PPM/ICD - Denies Device Orders - n/a Rep Notified - n/a  Chest x-ray - n/a EKG - 12-17-23 Stress Test - 08-30-20 ECHO - 10-05-21 Cardiac Cath - Denies  Sleep Study - Denies CPAP - n/a  Fasting Blood Sugar - 80-100 - newly diagnosed Checks Blood Sugar daily in the morning  Last dose of GLP1 agonist-  Semaglutide (Ozempic) GLP1 instructions: Per patient took last dose on 01-12-24 and will resume after surgery  Blood Thinner Instructions: Denies Aspirin Instructions: Denies  ERAS Protcol - NPO PRE-SURGERY Ensure or G2- none  COVID TEST- n/a   Anesthesia review: Yes, HTN, Lupus, Barretts esophagus, DM2,  Patient denies shortness of breath, fever, cough and chest pain at PAT appointment   All instructions explained to the patient, with a verbal understanding of the material. Patient agrees to go over the instructions while at home for a better understanding. Patient also instructed to self quarantine after being tested for COVID-19. The opportunity to ask questions was provided.

## 2024-01-20 ENCOUNTER — Ambulatory Visit (HOSPITAL_COMMUNITY)

## 2024-01-20 ENCOUNTER — Encounter (HOSPITAL_COMMUNITY): Payer: Self-pay | Admitting: Neurological Surgery

## 2024-01-20 ENCOUNTER — Other Ambulatory Visit: Payer: Self-pay

## 2024-01-20 ENCOUNTER — Encounter (HOSPITAL_COMMUNITY): Admission: RE | Disposition: A | Payer: Self-pay | Source: Home / Self Care | Attending: Neurological Surgery

## 2024-01-20 ENCOUNTER — Ambulatory Visit (HOSPITAL_COMMUNITY): Admitting: Anesthesiology

## 2024-01-20 ENCOUNTER — Ambulatory Visit (HOSPITAL_COMMUNITY): Payer: Self-pay | Admitting: Physician Assistant

## 2024-01-20 ENCOUNTER — Observation Stay (HOSPITAL_COMMUNITY)
Admission: RE | Admit: 2024-01-20 | Discharge: 2024-01-21 | Disposition: A | Discharge status: Home / Self Care | Attending: Neurological Surgery | Admitting: Neurological Surgery

## 2024-01-20 DIAGNOSIS — M50021 Cervical disc disorder at C4-C5 level with myelopathy: Secondary | ICD-10-CM | POA: Diagnosis not present

## 2024-01-20 DIAGNOSIS — E119 Type 2 diabetes mellitus without complications: Secondary | ICD-10-CM | POA: Diagnosis not present

## 2024-01-20 DIAGNOSIS — Z79899 Other long term (current) drug therapy: Secondary | ICD-10-CM | POA: Diagnosis not present

## 2024-01-20 DIAGNOSIS — M50121 Cervical disc disorder at C4-C5 level with radiculopathy: Secondary | ICD-10-CM | POA: Insufficient documentation

## 2024-01-20 DIAGNOSIS — Z794 Long term (current) use of insulin: Secondary | ICD-10-CM | POA: Insufficient documentation

## 2024-01-20 DIAGNOSIS — M542 Cervicalgia: Secondary | ICD-10-CM | POA: Diagnosis present

## 2024-01-20 DIAGNOSIS — M4712 Other spondylosis with myelopathy, cervical region: Principal | ICD-10-CM | POA: Insufficient documentation

## 2024-01-20 DIAGNOSIS — Z91040 Latex allergy status: Secondary | ICD-10-CM | POA: Insufficient documentation

## 2024-01-20 DIAGNOSIS — I10 Essential (primary) hypertension: Secondary | ICD-10-CM | POA: Insufficient documentation

## 2024-01-20 DIAGNOSIS — F332 Major depressive disorder, recurrent severe without psychotic features: Secondary | ICD-10-CM | POA: Diagnosis not present

## 2024-01-20 DIAGNOSIS — M50122 Cervical disc disorder at C5-C6 level with radiculopathy: Secondary | ICD-10-CM | POA: Insufficient documentation

## 2024-01-20 DIAGNOSIS — Z981 Arthrodesis status: Principal | ICD-10-CM

## 2024-01-20 DIAGNOSIS — G959 Disease of spinal cord, unspecified: Secondary | ICD-10-CM | POA: Diagnosis not present

## 2024-01-20 DIAGNOSIS — M50022 Cervical disc disorder at C5-C6 level with myelopathy: Secondary | ICD-10-CM | POA: Insufficient documentation

## 2024-01-20 DIAGNOSIS — M4802 Spinal stenosis, cervical region: Secondary | ICD-10-CM | POA: Insufficient documentation

## 2024-01-20 DIAGNOSIS — Z01818 Encounter for other preprocedural examination: Secondary | ICD-10-CM

## 2024-01-20 HISTORY — PX: ANTERIOR CERVICAL DECOMP/DISCECTOMY FUSION: SHX1161

## 2024-01-20 LAB — GLUCOSE, CAPILLARY
Glucose-Capillary: 83 mg/dL (ref 70–99)
Glucose-Capillary: 138 mg/dL — ABNORMAL HIGH (ref 70–99)
Glucose-Capillary: 332 mg/dL — ABNORMAL HIGH (ref 70–99)

## 2024-01-20 LAB — HEMOGLOBIN A1C
Hgb A1c MFr Bld: 5.1 % (ref 4.8–5.6)
Mean Plasma Glucose: 99.67 mg/dL

## 2024-01-20 LAB — ABO/RH: ABO/RH(D): O POS

## 2024-01-20 SURGERY — ANTERIOR CERVICAL DECOMPRESSION/DISCECTOMY FUSION 2 LEVELS
Anesthesia: General | Site: Spine Cervical

## 2024-01-20 MED ORDER — PANTOPRAZOLE SODIUM 40 MG PO TBEC
40.0000 mg | DELAYED_RELEASE_TABLET | Freq: Two times a day (BID) | ORAL | Status: DC
Start: 2024-01-20 — End: 2024-01-21
  Administered 2024-01-20: 40 mg via ORAL
  Filled 2024-01-20: qty 1

## 2024-01-20 MED ORDER — ACETAMINOPHEN 500 MG PO TABS
1000.0000 mg | ORAL_TABLET | Freq: Once | ORAL | Status: AC
  Administered 2024-01-20: 1000 mg via ORAL
  Filled 2024-01-20: qty 2

## 2024-01-20 MED ORDER — MIDAZOLAM HCL 2 MG/2ML IJ SOLN
INTRAMUSCULAR | Status: DC | PRN
  Administered 2024-01-20: 2 mg via INTRAVENOUS

## 2024-01-20 MED ORDER — ORAL CARE MOUTH RINSE: 15.0000 mL | Freq: Once | OROMUCOSAL | Status: AC

## 2024-01-20 MED ORDER — CITALOPRAM HYDROBROMIDE 20 MG PO TABS
20.0000 mg | ORAL_TABLET | Freq: Every day | ORAL | Status: DC
  Administered 2024-01-20: 20 mg via ORAL
  Filled 2024-01-20: qty 1

## 2024-01-20 MED ORDER — MENTHOL 3 MG MT LOZG
1.0000 | LOZENGE | OROMUCOSAL | Status: DC | PRN
  Administered 2024-01-21: 3 mg via ORAL
  Filled 2024-01-20: qty 9

## 2024-01-20 MED ORDER — THROMBIN 5000 UNITS EX KIT
PACK | CUTANEOUS | Status: AC
  Filled 2024-01-20: qty 1

## 2024-01-20 MED ORDER — LACTATED RINGERS IV SOLN: INTRAVENOUS | Status: DC

## 2024-01-20 MED ORDER — LIDOCAINE 2% (20 MG/ML) 5 ML SYRINGE
INTRAMUSCULAR | Status: DC | PRN
  Administered 2024-01-20: 100 mg via INTRAVENOUS

## 2024-01-20 MED ORDER — ONDANSETRON HCL 4 MG/2ML IJ SOLN
4.0000 mg | Freq: Four times a day (QID) | INTRAMUSCULAR | Status: DC | PRN
  Administered 2024-01-20: 4 mg via INTRAVENOUS
  Filled 2024-01-20: qty 2

## 2024-01-20 MED ORDER — ROCURONIUM BROMIDE 10 MG/ML (PF) SYRINGE
PREFILLED_SYRINGE | INTRAVENOUS | Status: AC
Start: 2024-01-20 — End: 2024-01-20
  Filled 2024-01-20: qty 10

## 2024-01-20 MED ORDER — 0.9 % SODIUM CHLORIDE (POUR BTL) OPTIME
TOPICAL | Status: DC | PRN
  Administered 2024-01-20: 1000 mL

## 2024-01-20 MED ORDER — FOLIC ACID 1 MG PO TABS
1.0000 mg | ORAL_TABLET | Freq: Every morning | ORAL | Status: DC
  Administered 2024-01-21: 1 mg via ORAL
  Filled 2024-01-20: qty 1

## 2024-01-20 MED ORDER — CHLORHEXIDINE GLUCONATE CLOTH 2 % EX PADS: 6.0000 | MEDICATED_PAD | Freq: Once | CUTANEOUS | Status: DC

## 2024-01-20 MED ORDER — DEXAMETHASONE 4 MG PO TABS
4.0000 mg | ORAL_TABLET | Freq: Four times a day (QID) | ORAL | Status: DC
  Administered 2024-01-20 – 2024-01-21 (×3): 4 mg via ORAL
  Filled 2024-01-20 (×3): qty 1

## 2024-01-20 MED ORDER — PROPOFOL 10 MG/ML IV BOLUS
INTRAVENOUS | Status: AC
  Filled 2024-01-20: qty 20

## 2024-01-20 MED ORDER — ONDANSETRON HCL 4 MG/2ML IJ SOLN
INTRAMUSCULAR | Status: AC
  Filled 2024-01-20: qty 2

## 2024-01-20 MED ORDER — FENTANYL CITRATE (PF) 100 MCG/2ML IJ SOLN
25.0000 ug | INTRAMUSCULAR | Status: DC | PRN
  Administered 2024-01-20 (×2): 50 ug via INTRAVENOUS

## 2024-01-20 MED ORDER — DEXAMETHASONE SODIUM PHOSPHATE 10 MG/ML IJ SOLN
INTRAMUSCULAR | Status: DC | PRN
  Administered 2024-01-20: 10 mg via INTRAVENOUS

## 2024-01-20 MED ORDER — AMISULPRIDE (ANTIEMETIC) 5 MG/2ML IV SOLN: 10.0000 mg | Freq: Once | INTRAVENOUS | Status: DC | PRN

## 2024-01-20 MED ORDER — ROCURONIUM BROMIDE 10 MG/ML (PF) SYRINGE
PREFILLED_SYRINGE | INTRAVENOUS | Status: DC | PRN
  Administered 2024-01-20: 20 mg via INTRAVENOUS
  Administered 2024-01-20: 50 mg via INTRAVENOUS
  Administered 2024-01-20: 20 mg via INTRAVENOUS

## 2024-01-20 MED ORDER — THROMBIN 5000 UNITS EX SOLR
OROMUCOSAL | Status: DC | PRN
  Administered 2024-01-20: 5 mL via TOPICAL

## 2024-01-20 MED ORDER — CHLORHEXIDINE GLUCONATE 0.12 % MT SOLN
15.0000 mL | Freq: Once | OROMUCOSAL | Status: AC
  Administered 2024-01-20: 15 mL via OROMUCOSAL
  Filled 2024-01-20: qty 15

## 2024-01-20 MED ORDER — GABAPENTIN 100 MG PO CAPS
100.0000 mg | ORAL_CAPSULE | Freq: Three times a day (TID) | ORAL | Status: DC
  Administered 2024-01-20: 100 mg via ORAL
  Filled 2024-01-20: qty 1

## 2024-01-20 MED ORDER — DEXAMETHASONE SODIUM PHOSPHATE 10 MG/ML IJ SOLN
INTRAMUSCULAR | Status: AC
  Filled 2024-01-20: qty 1

## 2024-01-20 MED ORDER — FENTANYL CITRATE (PF) 250 MCG/5ML IJ SOLN
INTRAMUSCULAR | Status: AC
  Filled 2024-01-20: qty 5

## 2024-01-20 MED ORDER — TRIAMTERENE-HCTZ 37.5-25 MG PO TABS
1.0000 | ORAL_TABLET | Freq: Every day | ORAL | Status: DC
Start: 2024-01-20 — End: 2024-01-21
  Filled 2024-01-20 (×2): qty 1

## 2024-01-20 MED ORDER — PROPOFOL 10 MG/ML IV BOLUS
INTRAVENOUS | Status: DC | PRN
  Administered 2024-01-20: 120 mg via INTRAVENOUS

## 2024-01-20 MED ORDER — POTASSIUM CHLORIDE IN NACL 20-0.9 MEQ/L-% IV SOLN
INTRAVENOUS | Status: DC
Start: 2024-01-20 — End: 2024-01-21
  Filled 2024-01-20: qty 1000

## 2024-01-20 MED ORDER — BUPIVACAINE HCL (PF) 0.25 % IJ SOLN
INTRAMUSCULAR | Status: AC
  Filled 2024-01-20: qty 30

## 2024-01-20 MED ORDER — FENTANYL CITRATE (PF) 250 MCG/5ML IJ SOLN
INTRAMUSCULAR | Status: DC | PRN
  Administered 2024-01-20: 100 ug via INTRAVENOUS
  Administered 2024-01-20 (×3): 50 ug via INTRAVENOUS

## 2024-01-20 MED ORDER — ONDANSETRON HCL 4 MG/2ML IJ SOLN: 4.0000 mg | Freq: Once | INTRAMUSCULAR | Status: DC | PRN

## 2024-01-20 MED ORDER — VANCOMYCIN HCL IN DEXTROSE 1-5 GM/200ML-% IV SOLN
1000.0000 mg | Freq: Once | INTRAVENOUS | Status: AC
  Administered 2024-01-21: 1000 mg via INTRAVENOUS
  Filled 2024-01-20: qty 200

## 2024-01-20 MED ORDER — ONDANSETRON HCL 4 MG PO TABS: 4.0000 mg | ORAL_TABLET | Freq: Four times a day (QID) | ORAL | Status: DC | PRN

## 2024-01-20 MED ORDER — TOPIRAMATE 25 MG PO TABS: 100.0000 mg | ORAL_TABLET | Freq: Every day | ORAL | Status: DC

## 2024-01-20 MED ORDER — SODIUM CHLORIDE 0.9 % IV SOLN: 250.0000 mL | INTRAVENOUS | Status: DC

## 2024-01-20 MED ORDER — INSULIN ASPART 100 UNIT/ML IJ SOLN
0.0000 [IU] | Freq: Three times a day (TID) | INTRAMUSCULAR | Status: DC
  Administered 2024-01-21: 2 [IU] via SUBCUTANEOUS

## 2024-01-20 MED ORDER — SUGAMMADEX SODIUM 200 MG/2ML IV SOLN
INTRAVENOUS | Status: DC | PRN
  Administered 2024-01-20: 200 mg via INTRAVENOUS

## 2024-01-20 MED ORDER — INSULIN ASPART 100 UNIT/ML IJ SOLN
0.0000 [IU] | Freq: Every day | INTRAMUSCULAR | Status: DC
  Administered 2024-01-20: 5 [IU] via SUBCUTANEOUS

## 2024-01-20 MED ORDER — ACETAMINOPHEN 650 MG RE SUPP: 650.0000 mg | RECTAL | Status: DC | PRN

## 2024-01-20 MED ORDER — VANCOMYCIN HCL IN DEXTROSE 1-5 GM/200ML-% IV SOLN
1000.0000 mg | INTRAVENOUS | Status: AC
  Administered 2024-01-20: 1000 mg via INTRAVENOUS
  Filled 2024-01-20: qty 200

## 2024-01-20 MED ORDER — FENTANYL CITRATE (PF) 100 MCG/2ML IJ SOLN
INTRAMUSCULAR | Status: AC
  Filled 2024-01-20: qty 2

## 2024-01-20 MED ORDER — ONDANSETRON HCL 4 MG/2ML IJ SOLN
INTRAMUSCULAR | Status: DC | PRN
  Administered 2024-01-20: 4 mg via INTRAVENOUS

## 2024-01-20 MED ORDER — PHENOL 1.4 % MT LIQD: 1.0000 | OROMUCOSAL | Status: DC | PRN

## 2024-01-20 MED ORDER — SENNA 8.6 MG PO TABS
1.0000 | ORAL_TABLET | Freq: Two times a day (BID) | ORAL | Status: DC
Start: 2024-01-20 — End: 2024-01-21
  Administered 2024-01-20: 8.6 mg via ORAL
  Filled 2024-01-20: qty 1

## 2024-01-20 MED ORDER — PHENYLEPHRINE HCL-NACL 20-0.9 MG/250ML-% IV SOLN
INTRAVENOUS | Status: DC | PRN
  Administered 2024-01-20: 15 ug/min via INTRAVENOUS

## 2024-01-20 MED ORDER — METHOCARBAMOL 500 MG PO TABS
500.0000 mg | ORAL_TABLET | Freq: Four times a day (QID) | ORAL | Status: DC | PRN
  Administered 2024-01-21 (×2): 500 mg via ORAL
  Filled 2024-01-20 (×2): qty 1

## 2024-01-20 MED ORDER — DEXAMETHASONE SODIUM PHOSPHATE 4 MG/ML IJ SOLN: 4.0000 mg | Freq: Four times a day (QID) | INTRAMUSCULAR | Status: DC

## 2024-01-20 MED ORDER — ACETAMINOPHEN 325 MG PO TABS: 650.0000 mg | ORAL_TABLET | ORAL | Status: DC | PRN

## 2024-01-20 MED ORDER — SODIUM CHLORIDE 0.9% FLUSH: 3.0000 mL | Freq: Two times a day (BID) | INTRAVENOUS | Status: DC

## 2024-01-20 MED ORDER — SODIUM CHLORIDE 0.9% FLUSH: 3.0000 mL | INTRAVENOUS | Status: DC | PRN

## 2024-01-20 MED ORDER — MIDAZOLAM HCL 2 MG/2ML IJ SOLN
INTRAMUSCULAR | Status: AC
Start: 2024-01-20 — End: 2024-01-20
  Filled 2024-01-20: qty 2

## 2024-01-20 MED ORDER — BUPIVACAINE HCL (PF) 0.25 % IJ SOLN
INTRAMUSCULAR | Status: DC | PRN
  Administered 2024-01-20: 4 mL

## 2024-01-20 MED ORDER — HYDROCODONE-ACETAMINOPHEN 5-325 MG PO TABS
1.0000 | ORAL_TABLET | ORAL | Status: DC | PRN
  Administered 2024-01-20 – 2024-01-21 (×3): 1 via ORAL
  Filled 2024-01-20 (×3): qty 1

## 2024-01-20 MED ORDER — LIDOCAINE 2% (20 MG/ML) 5 ML SYRINGE
INTRAMUSCULAR | Status: AC
  Filled 2024-01-20: qty 5

## 2024-01-20 MED ORDER — CYCLOBENZAPRINE HCL 10 MG PO TABS
10.0000 mg | ORAL_TABLET | Freq: Three times a day (TID) | ORAL | Status: DC
  Administered 2024-01-20: 10 mg via ORAL
  Filled 2024-01-20: qty 1

## 2024-01-20 MED ORDER — PROPOFOL 500 MG/50ML IV EMUL
INTRAVENOUS | Status: DC | PRN
  Administered 2024-01-20: 50 ug/kg/min via INTRAVENOUS

## 2024-01-20 MED ORDER — METHOCARBAMOL 1000 MG/10ML IJ SOLN: 500.0000 mg | Freq: Four times a day (QID) | INTRAMUSCULAR | Status: DC | PRN

## 2024-01-20 MED ORDER — LOSARTAN POTASSIUM 25 MG PO TABS
25.0000 mg | ORAL_TABLET | Freq: Every day | ORAL | Status: DC
Start: 2024-01-20 — End: 2024-01-21
  Filled 2024-01-20: qty 1

## 2024-01-20 SURGICAL SUPPLY — 48 items
BAG COUNTER SPONGE SURGICOUNT (BAG) ×1 IMPLANT
BAND RUBBER #18 3X1/16 STRL (MISCELLANEOUS) ×2 IMPLANT
BASKET BONE COLLECTION (BASKET) ×1 IMPLANT
BENZOIN TINCTURE PRP APPL 2/3 (GAUZE/BANDAGES/DRESSINGS) ×1 IMPLANT
BIT DRILL 2.3 STRL 12 (BIT) IMPLANT
BONE MATRIX GEL 5CC (Bone Implant) IMPLANT
BUR CARBIDE MATCH 3.0 (BURR) ×1 IMPLANT
CANISTER SUCTION 3000ML PPV (SUCTIONS) ×1 IMPLANT
DRAPE C-ARM 42X72 X-RAY (DRAPES) ×2 IMPLANT
DRAPE LAPAROTOMY 100X72 PEDS (DRAPES) ×1 IMPLANT
DRAPE MICROSCOPE SLANT 54X150 (MISCELLANEOUS) IMPLANT
DRSG OPSITE POSTOP 3X4 (GAUZE/BANDAGES/DRESSINGS) IMPLANT
DURAPREP 6ML APPLICATOR 50/CS (WOUND CARE) ×1 IMPLANT
ELECT COATED BLADE 2.86 ST (ELECTRODE) ×1 IMPLANT
ELECTRODE REM PT RTRN 9FT ADLT (ELECTROSURGICAL) ×1 IMPLANT
GAUZE 4X4 16PLY ~~LOC~~+RFID DBL (SPONGE) IMPLANT
GLOVE BIO SURGEON STRL SZ7 (GLOVE) ×1 IMPLANT
GLOVE BIO SURGEON STRL SZ8 (GLOVE) ×1 IMPLANT
GLOVE BIOGEL PI IND STRL 7.0 (GLOVE) ×1 IMPLANT
GOWN STRL REUS W/ TWL LRG LVL3 (GOWN DISPOSABLE) ×1 IMPLANT
GOWN STRL REUS W/ TWL XL LVL3 (GOWN DISPOSABLE) ×1 IMPLANT
GOWN STRL REUS W/TWL 2XL LVL3 (GOWN DISPOSABLE) IMPLANT
HEMOSTAT POWDER KIT SURGIFOAM (HEMOSTASIS) ×1 IMPLANT
KIT BASIN OR (CUSTOM PROCEDURE TRAY) ×1 IMPLANT
KIT TURNOVER KIT B (KITS) ×1 IMPLANT
NDL HYPO 25X1 1.5 SAFETY (NEEDLE) ×1 IMPLANT
NDL SPNL 20GX3.5 QUINCKE YW (NEEDLE) ×1 IMPLANT
NEEDLE HYPO 25X1 1.5 SAFETY (NEEDLE) ×1 IMPLANT
NEEDLE SPNL 20GX3.5 QUINCKE YW (NEEDLE) ×1 IMPLANT
PACK LAMINECTOMY NEURO (CUSTOM PROCEDURE TRAY) ×1 IMPLANT
PAD ARMBOARD POSITIONER FOAM (MISCELLANEOUS) ×3 IMPLANT
PIN DISTRACTION 14MM (PIN) ×2 IMPLANT
PLATE ANTC INSIG 32 2L (Plate) IMPLANT
SCREW VA SINGLE LEAD 4X14 ST (Screw) IMPLANT
SCREW VA ST SINGLE LEAD 4X12 (Screw) IMPLANT
SOLN 0.9% NACL 1000 ML (IV SOLUTION) ×1 IMPLANT
SOLN 0.9% NACL POUR BTL 1000ML (IV SOLUTION) ×1 IMPLANT
SOLN STERILE WATER 1000 ML (IV SOLUTION) ×1 IMPLANT
SOLN STERILE WATER BTL 1000 ML (IV SOLUTION) ×1 IMPLANT
SPACER IDENTITI 5X14X12 7D CRV (Spacer) IMPLANT
SPACER IDENTITI 6X14X12 7D ANG (Spacer) IMPLANT
SPONGE INTESTINAL PEANUT (DISPOSABLE) ×1 IMPLANT
SPONGE SURGIFOAM ABS GEL SZ50 (HEMOSTASIS) IMPLANT
STRIP CLOSURE SKIN 1/2X4 (GAUZE/BANDAGES/DRESSINGS) ×1 IMPLANT
SUT VIC AB 3-0 SH 8-18 (SUTURE) ×2 IMPLANT
SUT VIC AB 4-0 PS2 18 (SUTURE) IMPLANT
TOWEL GREEN STERILE (TOWEL DISPOSABLE) ×1 IMPLANT
TOWEL GREEN STERILE FF (TOWEL DISPOSABLE) ×1 IMPLANT
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
## 2024-01-20 NOTE — Anesthesia Procedure Notes (Signed)
 Procedure Name: Intubation Date/Time: 01/20/2024 2:22 PM  Performed by: Lanning Cena RAMAN, CRNAPre-anesthesia Checklist: Patient identified, Emergency Drugs available, Suction available, Patient being monitored and Timeout performed Patient Re-evaluated:Patient Re-evaluated prior to induction Oxygen Delivery Method: Circle system utilized Preoxygenation: Pre-oxygenation with 100% oxygen Induction Type: IV induction Ventilation: Mask ventilation without difficulty Laryngoscope Size: Glidescope and 3 Grade View: Grade I Tube type: Oral Tube size: 7.0 mm Number of attempts: 1 Airway Equipment and Method: Rigid stylet and Video-laryngoscopy Placement Confirmation: ETT inserted through vocal cords under direct vision, positive ETCO2, CO2 detector and breath sounds checked- equal and bilateral Secured at: 20 cm Tube secured with: Tape Dental Injury: Teeth and Oropharynx as per pre-operative assessment

## 2024-01-20 NOTE — Anesthesia Preprocedure Evaluation (Addendum)
 Anesthesia Evaluation  Patient identified by MRN, date of birth, ID band Patient awake    Reviewed: Allergy & Precautions, NPO status , Patient's Chart, lab work & pertinent test results  Airway Mallampati: I  TM Distance: >3 FB Neck ROM: Limited    Dental  (+) Teeth Intact, Dental Advisory Given   Pulmonary neg pulmonary ROS   Pulmonary exam normal breath sounds clear to auscultation       Cardiovascular hypertension, Pt. on medications Normal cardiovascular exam Rhythm:Regular Rate:Normal     Neuro/Psych  Headaches PSYCHIATRIC DISORDERS Anxiety Depression    Cervical myelopathy  Neuromuscular disease    GI/Hepatic Neg liver ROS, PUD,GERD  Medicated,,  Endo/Other  diabetes, Type 2    Renal/GU negative Renal ROS     Musculoskeletal  (+) Arthritis ,    Abdominal   Peds  Hematology negative hematology ROS (+)   Anesthesia Other Findings Day of surgery medications reviewed with the patient.  Reproductive/Obstetrics                              Anesthesia Physical Anesthesia Plan  ASA: 2  Anesthesia Plan: General   Post-op Pain Management: Tylenol  PO (pre-op)*   Induction: Intravenous  PONV Risk Score and Plan: 3 and Midazolam, Dexamethasone and Ondansetron   Airway Management Planned: Oral ETT and Video Laryngoscope Planned  Additional Equipment:   Intra-op Plan:   Post-operative Plan: Extubation in OR  Informed Consent: I have reviewed the patients History and Physical, chart, labs and discussed the procedure including the risks, benefits and alternatives for the proposed anesthesia with the patient or authorized representative who has indicated his/her understanding and acceptance.     Dental advisory given  Plan Discussed with: CRNA  Anesthesia Plan Comments:          Anesthesia Quick Evaluation

## 2024-01-20 NOTE — Op Note (Signed)
 01/20/2024  4:30 PM  PATIENT:  Maria Pittman  63 y.o. 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 Decompressive anterior cervical discectomy C4-5 C5-6, 2. Anterior cervical arthrodesis C4-5 C5-6 utilizing a PTI interbody cage packed with locally harvested morcellized autologous bone graft and DBM putty, 3. Anterior cervical plating C4-C6 inclusive utilizing a ATEC plate  SURGEON:  Alm Molt, MD  ASSISTANTS: Suzen Pean, FNP  ANESTHESIA:   General  EBL: 20 ml  Total I/O In: -  Out: 20 [Blood:20]  BLOOD ADMINISTERED: none  DRAINS: none  SPECIMEN:  none  INDICATION FOR PROCEDURE: This patient presented with neck pain and right arm pain. Imaging showed cervical spondylosis with cervical spinal stenosis C4-5 C5-6. The patient tried conservative measures without relief. Pain was debilitating. Recommended ACDF with plating. Patient understood the risks, benefits, and alternatives and potential outcomes and wished to proceed.  PROCEDURE DETAILS: Patient was brought to the operating room placed under general endotracheal anesthesia. Patient was placed in the supine position on the operating room table. The neck was prepped with Duraprep and draped in a sterile fashion.   Three cc of local anesthesia was injected and a transverse incision was made on the right side of the neck.  Dissection was carried down thru the subcutaneous tissue and the platysma was  elevated, opened, and undermined with Metzenbaum scissors.  Dissection was then carried out thru an avascular plane leaving the sternocleidomastoid carotid artery and jugular vein laterally and the trachea and esophagus medially with the assistance of my nurse practitioner. The ventral aspect of the vertebral column was identified and a localizing x-ray was taken. The C4-5 level was identified and all in the room  agreed with the level. The longus colli muscles were then elevated and the retractor was placed with the assistance of my nurse practitioner to expose C4-5 and C5-6. The annulus was incised and the disc space entered. Discectomy was performed with micro-curettes and pituitary rongeurs. I then used the high-speed drill to drill the endplates down to the level of the posterior longitudinal ligament. The drill shavings were saved in a mucous trap for later arthrodesis. The operating microscope was draped and brought into the field provided additional magnification, illumination and visualization. Discectomy was continued posteriorly thru the disc space. Posterior longitudinal ligament was opened with a nerve hook, and then removed along with disc herniation and osteophytes, decompressing the spinal canal and thecal sac. We then continued to remove osteophytic overgrowth and disc material decompressing the neural foramina and exiting nerve roots bilaterally. The scope was angled up and down to help decompress and undercut the vertebral bodies. Once the decompression was completed we could pass a nerve hook circumferentially to assure adequate decompression in the midline and in the neural foramina. So by both visualization and palpation we felt we had an adequate decompression of the neural elements. We then measured the height of the intravertebral disc space and selected a 6 millimeter PTI interbody cage packed with autograft and DBM putty. It was then gently positioned in the intravertebral disc space(s) and countersunk. I then used a 32 mm ATEC plate and placed variable angle screws into the vertebral bodies of each level and locked them into position. The wound was irrigated with bacitracin solution, checked for hemostasis which was established and confirmed. Once meticulous hemostasis was achieved, we then proceeded with closure with the assistance of my nurse practitioner. The  platysma was closed with interrupted  3-0 undyed Vicryl suture, the subcuticular layer was closed with interrupted 3-0 undyed Vicryl suture. The skin edges were approximated with steristrips. The drapes were removed. A sterile dressing was applied. The patient was then awakened from general anesthesia and transferred to the recovery room in stable condition. At the end of the procedure all sponge, needle and instrument counts were correct.   PLAN OF CARE: Admit for overnight observation  PATIENT DISPOSITION:  PACU - hemodynamically stable.   Delay start of Pharmacological VTE agent (>24hrs) due to surgical blood loss or risk of bleeding:  yes

## 2024-01-20 NOTE — Transfer of Care (Signed)
 Immediate Anesthesia Transfer of Care Note  Patient: Maria Pittman  Procedure(s) Performed: ANTERIOR CERVICAL DECOMPRESSION/DISCECTOMY FUSION CERVICAL FOUR-SIX (Spine Cervical)  Patient Location: PACU  Anesthesia Type:General  Level of Consciousness: awake, alert , and oriented  Airway & Oxygen Therapy: Patient Spontanous Breathing and Patient connected to face mask oxygen  Post-op Assessment: Report given to RN and Post -op Vital signs reviewed and stable  Post vital signs: Reviewed and stable  Last Vitals:  Vitals Value Taken Time  BP 155/73 01/20/24 16:40  Temp 36.7 C 01/20/24 16:40  Pulse 70 01/20/24 16:40  Resp 10 01/20/24 16:40  SpO2 98 % 01/20/24 16:40  Vitals shown include unfiled device data.  Last Pain:  Vitals:   01/20/24 1252  TempSrc:   PainSc: 4       Patients Stated Pain Goal: 1 (01/20/24 1234)  Complications: No notable events documented.

## 2024-01-21 DIAGNOSIS — M4712 Other spondylosis with myelopathy, cervical region: Secondary | ICD-10-CM | POA: Diagnosis not present

## 2024-01-21 LAB — GLUCOSE, CAPILLARY: Glucose-Capillary: 148 mg/dL — ABNORMAL HIGH (ref 70–99)

## 2024-01-21 MED ORDER — HYDROCODONE-ACETAMINOPHEN 5-325 MG PO TABS: 1.0000 | ORAL_TABLET | ORAL | 0 refills | Status: AC | PRN

## 2024-01-21 NOTE — Evaluation (Signed)
 Occupational Therapy Evaluation Patient Details Name: Maria Pittman MRN: [id number] DOB: 1960/11/04 Today's Date: 01/21/2024   History of Present Illness   63 yo F s/p ACDF.  PMH includes: DMT2, HTN, Lupus.     Clinical Impressions Patient admitted for the procedure above.  PTA she lives at home with her spouse, and remains independent.  Patient is at her baseline, needing no assist for ADL,iADL or mobility.  Good understanding of all precautions and no further OT needs in the acute setting.  Recommend follow up as prescribed by MD.       If plan is discharge home, recommend the following:   Assist for transportation     Functional Status Assessment   Patient has not had a recent decline in their functional status     Equipment Recommendations   None recommended by OT     Recommendations for Other Services         Precautions/Restrictions   Precautions Precautions: Cervical Precaution Booklet Issued: Yes (comment) Recall of Precautions/Restrictions: Intact Restrictions Weight Bearing Restrictions Per Provider Order: No     Mobility Bed Mobility Overal bed mobility: Modified Independent                  Transfers Overall transfer level: Independent                        Balance Overall balance assessment: No apparent balance deficits (not formally assessed)                                         ADL either performed or assessed with clinical judgement   ADL Overall ADL's : Independent                                             Vision Patient Visual Report: No change from baseline       Perception Perception: Not tested       Praxis Praxis: Not tested       Pertinent Vitals/Pain Pain Assessment Pain Assessment: Faces Faces Pain Scale: Hurts a little bit Pain Location: Incisional Pain Descriptors / Indicators: Aching Pain Intervention(s): Monitored during session      Extremity/Trunk Assessment Upper Extremity Assessment Upper Extremity Assessment: Overall WFL for tasks assessed   Lower Extremity Assessment Lower Extremity Assessment: Overall WFL for tasks assessed   Cervical / Trunk Assessment Cervical / Trunk Assessment: Neck Surgery   Communication Communication Communication: No apparent difficulties   Cognition Arousal: Alert Behavior During Therapy: WFL for tasks assessed/performed Cognition: No apparent impairments                               Following commands: Intact       Cueing  General Comments   Cueing Techniques: Verbal cues   VSS on RA   Exercises     Shoulder Instructions      Home Living Family/patient expects to be discharged to:: Private residence Living Arrangements: Spouse/significant other Available Help at Discharge: Family;Available 24 hours/day Type of Home: House Home Access: Level entry     Home Layout: One level     Bathroom Shower/Tub: Chief Strategy Officer: Standard  Home Equipment: Grab bars - tub/shower          Prior Functioning/Environment Prior Level of Function : Independent/Modified Independent;Driving                    OT Problem List: Pain   OT Treatment/Interventions:        OT Goals(Current goals can be found in the care plan section)   Acute Rehab OT Goals Patient Stated Goal: Return home OT Goal Formulation: With patient Time For Goal Achievement: 01/22/24 Potential to Achieve Goals: Good   OT Frequency:       Co-evaluation              AM-PAC OT 6 Clicks Daily Activity     Outcome Measure Help from another person eating meals?: None Help from another person taking care of personal grooming?: None Help from another person toileting, which includes using toliet, bedpan, or urinal?: None Help from another person bathing (including washing, rinsing, drying)?: None Help from another person to put on and  taking off regular upper body clothing?: None Help from another person to put on and taking off regular lower body clothing?: None 6 Click Score: 24   End of Session Nurse Communication: Mobility status  Activity Tolerance: Patient tolerated treatment well Patient left: in bed;with call bell/phone within reach  OT Visit Diagnosis: Pain                Time: 9175-9153 OT Time Calculation (min): 22 min Charges:  OT General Charges $OT Visit: 1 Visit OT Evaluation $OT Eval Moderate Complexity: 1 Mod  01/21/2024  RP, OTR/L  Acute Rehabilitation Services  Office:  [phone]   Maria Pittman 01/21/2024, 8:52 AM

## 2024-01-21 NOTE — Discharge Summary (Signed)
 Physician Discharge Summary  Patient ID: Maria Pittman MRN: [id number] DOB/AGE: 1960/10/24 63 y.o.  Admit date: 01/20/2024 Discharge date: 01/21/2024  Admission Diagnoses: cervical stenosis    Discharge Diagnoses: same   Discharged Condition: good  Hospital Course: The patient was admitted on 01/20/2024 and taken to the operating room where the patient underwent ACDF C4-5 c5-6. The patient tolerated the procedure well and was taken to the recovery room and then to the floor in stable condition. The hospital course was routine. There were no complications. The wound remained clean dry and intact. Pt had appropriate neck soreness. No complaints of arm pain or new N/T/W. The patient remained afebrile with stable vital signs, and tolerated a regular diet. The patient continued to increase activities, and pain was well controlled with oral pain medications.   Consults: None  Significant Diagnostic Studies:  Results for orders placed or performed during the hospital encounter of 01/20/24  Glucose, capillary   Collection Time: 01/20/24 12:30 PM  Result Value Ref Range   Glucose-Capillary 83 70 - 99 mg/dL  Hemoglobin J8r   Collection Time: 01/20/24 12:44 PM  Result Value Ref Range   Hgb A1c MFr Bld 5.1 4.8 - 5.6 %   Mean Plasma Glucose 99.67 mg/dL  ABO/Rh   Collection Time: 01/20/24 12:44 PM  Result Value Ref Range   ABO/RH(D)      O POS Performed at [facility], [address], [place], [place] [address]   Glucose, capillary   Collection Time: 01/20/24  6:29 PM  Result Value Ref Range   Glucose-Capillary 138 (H) 70 - 99 mg/dL  Glucose, capillary   Collection Time: 01/20/24  8:54 PM  Result Value Ref Range   Glucose-Capillary 332 (H) 70 - 99 mg/dL   Comment 1 Notify RN    Comment 2 Document in Chart   Glucose, capillary   Collection Time: 01/21/24  6:29 AM  Result Value Ref Range   Glucose-Capillary 148 (H) 70 - 99 mg/dL   Comment 1 Notify RN    Comment 2 Document in  Chart     DG Cervical Spine 1 View Result Date: 01/20/2024 CLINICAL DATA:  Elective surgery. EXAM: DG CERVICAL SPINE - 1 VIEW COMPARISON:  None Available. FINDINGS: Single fluoroscopic spot view of the cervical spine submitted from the operating room. Anterior fusion C4 through C6 with interbody spacers. Fluoroscopy time 24 seconds. Dose 2.88 mGy. IMPRESSION: Procedural fluoroscopy during cervical spine surgery. Electronically Signed   By: Andrea Gasman M.D.   On: 01/20/2024 17:34   DG C-Arm 1-60 Min-No Report Result Date: 01/20/2024 Fluoroscopy was utilized by the requesting physician.  No radiographic interpretation.   DG C-Arm 1-60 Min-No Report Result Date: 01/20/2024 Fluoroscopy was utilized by the requesting physician.  No radiographic interpretation.   MR BRAIN W WO CONTRAST Result Date: 01/12/2024 EXAM: MRI BRAIN WITH AND WITHOUT CONTRAST MRI CERVICAL SPINE WITHOUT AND WITH CONTRAST 01/07/2024 12:34:32 PM TECHNIQUE: Multiplanar multisequence MRI of the brain was performed with and without the administration of intravenous contrast. Multiplanar multisequence MRI of the cervical spine was performed without and with the administration of intravenous contrast. COMPARISON: 09/27/2021 CLINICAL HISTORY: Aneurysm of ophthalmic artery FINDINGS: MRI BRAIN: BRAIN AND VENTRICLES: No acute infarct. No acute intracranial hemorrhage. No mass or abnormal enhancement. No midline shift. No hydrocephalus. The sella is unremarkable. Normal flow voids. Unchanged distribution of T2-weighted signal lesions of the supratentorial white matter. No contrast-enhancing lesions. ORBITS: No acute abnormality. SINUSES AND MASTOIDS: No acute  abnormality. BONES AND SOFT TISSUES: Normal bone marrow signal. No acute soft tissue abnormality. MRI CERVICAL SPINE: BONES AND ALIGNMENT: Normal alignment. Normal vertebral body heights. Marrow signal is unremarkable. No abnormal enhancement. Modic type 2 endplate signal changes at  C5-6. SPINAL CORD: Normal spinal cord size. Normal spinal cord signal. SOFT TISSUES: Unremarkable. C2-C3: No significant disc herniation. No spinal canal stenosis or neural foraminal narrowing. C3-C4: No significant disc herniation. No spinal canal stenosis or neural foraminal narrowing. C4-C5: Small disc bulge with bilateral uncovertebral hypertrophy and severe bilateral foraminal stenosis. Mild spinal canal stenosis. C5-C6: Small disc osteophyte complex with severe spinal canal stenosis. C6-C7: Small right subarticular disc protrusion with mild spinal canal stenosis and mild right foraminal stenosis. C7-T1: Small left subarticular disc protrusion. No central spinal canal or neural foraminal stenosis. IMPRESSION: 1. Unchanged distribution of chronic white matter lesions most typical of chronic ischemic microangiopathy 2. C4-5 mild spinal canal stenosis and severe bilateral neural foraminal stenosis 3.  C5-6 severe spinal canal stenosis 4. C6-7 mild spinal canal stenosis and mild right neural foraminal stenosis Electronically signed by: Franky Stanford MD 01/12/2024 12:25 AM EDT RP Workstation: [id number]   MR CERVICAL SPINE W WO CONTRAST Result Date: 01/12/2024 EXAM: MRI BRAIN WITH AND WITHOUT CONTRAST MRI CERVICAL SPINE WITHOUT AND WITH CONTRAST 01/07/2024 12:34:32 PM TECHNIQUE: Multiplanar multisequence MRI of the brain was performed with and without the administration of intravenous contrast. Multiplanar multisequence MRI of the cervical spine was performed without and with the administration of intravenous contrast. COMPARISON: 09/27/2021 CLINICAL HISTORY: Aneurysm of ophthalmic artery FINDINGS: MRI BRAIN: BRAIN AND VENTRICLES: No acute infarct. No acute intracranial hemorrhage. No mass or abnormal enhancement. No midline shift. No hydrocephalus. The sella is unremarkable. Normal flow voids. Unchanged distribution of T2-weighted signal lesions of the supratentorial white matter. No contrast-enhancing lesions.  ORBITS: No acute abnormality. SINUSES AND MASTOIDS: No acute abnormality. BONES AND SOFT TISSUES: Normal bone marrow signal. No acute soft tissue abnormality. MRI CERVICAL SPINE: BONES AND ALIGNMENT: Normal alignment. Normal vertebral body heights. Marrow signal is unremarkable. No abnormal enhancement. Modic type 2 endplate signal changes at C5-6. SPINAL CORD: Normal spinal cord size. Normal spinal cord signal. SOFT TISSUES: Unremarkable. C2-C3: No significant disc herniation. No spinal canal stenosis or neural foraminal narrowing. C3-C4: No significant disc herniation. No spinal canal stenosis or neural foraminal narrowing. C4-C5: Small disc bulge with bilateral uncovertebral hypertrophy and severe bilateral foraminal stenosis. Mild spinal canal stenosis. C5-C6: Small disc osteophyte complex with severe spinal canal stenosis. C6-C7: Small right subarticular disc protrusion with mild spinal canal stenosis and mild right foraminal stenosis. C7-T1: Small left subarticular disc protrusion. No central spinal canal or neural foraminal stenosis. IMPRESSION: 1. Unchanged distribution of chronic white matter lesions most typical of chronic ischemic microangiopathy 2. C4-5 mild spinal canal stenosis and severe bilateral neural foraminal stenosis 3.  C5-6 severe spinal canal stenosis 4. C6-7 mild spinal canal stenosis and mild right neural foraminal stenosis Electronically signed by: Franky Stanford MD 01/12/2024 12:25 AM EDT RP Workstation: [id number]    Antibiotics:  Anti-infectives (From admission, onward)    Start     Dose/Rate Route Frequency Ordered Stop   01/21/24 0600  vancomycin (VANCOCIN) IVPB 1000 mg/200 mL premix        1,000 mg 200 mL/hr over 60 Minutes Intravenous On call to O.R. 01/20/24 1222 01/20/24 1353   01/21/24 0100  vancomycin (VANCOCIN) IVPB 1000 mg/200 mL premix        1,000 mg 200 mL/hr over 60 Minutes  Intravenous  Once 01/20/24 1845 01/21/24 0134       Discharge Exam: Blood pressure  (!) 109/51, pulse 85, temperature 98.6 F (37 C), temperature source Oral, resp. rate 16, height 5' 1 (1.549 m), weight 57.6 kg, SpO2 100%. Neurologic: Grossly normal Dressing dry  Discharge Medications:   Allergies as of 01/21/2024       Reactions   Penicillins Anaphylaxis   Codeine Nausea Only   Nsaids Other (See Comments)   Ulcers   Benzoin Rash   Latex Rash   Wound Dressing Adhesive Rash        Medication List     TAKE these medications    acetaminophen  500 MG tablet Commonly known as: TYLENOL  Take 500-1,000 mg by mouth every 6 (six) hours as needed (pain.).   ADULT ONE DAILY GUMMIES PO Take 2 tablets by mouth in the morning.   cetirizine 10 MG tablet Commonly known as: ZYRTEC Take 10 mg by mouth in the morning.   citalopram  20 MG tablet Commonly known as: CELEXA  Take 20 mg by mouth in the morning.   cyanocobalamin 1000 MCG/ML injection Commonly known as: VITAMIN B12 Inject 1,000 mcg into the muscle every 30 (thirty) days.   cyclobenzaprine 10 MG tablet Commonly known as: FLEXERIL Take 10 mg by mouth 3 (three) times daily.   folic acid  1 MG tablet Commonly known as: FOLVITE  Take 1 mg by mouth in the morning.   gabapentin  100 MG capsule Commonly known as: NEURONTIN  Take 1 capsule (100 mg total) by mouth 3 (three) times daily. For agitation   HYDROcodone-acetaminophen  5-325 MG tablet Commonly known as: NORCO/VICODIN Take 1 tablet by mouth every 4 (four) hours as needed for moderate pain (pain score 4-6).   losartan  25 MG tablet Commonly known as: COZAAR  Take 25 mg by mouth in the morning.   Ozempic (2 MG/DOSE) 8 MG/3ML Sopn Generic drug: Semaglutide (2 MG/DOSE) Inject 2 mg into the skin every Sunday.   pantoprazole  40 MG tablet Commonly known as: PROTONIX  Take 40 mg by mouth 2 (two) times daily.   promethazine 25 MG tablet Commonly known as: PHENERGAN Take 25 mg by mouth daily as needed for nausea or vomiting.   rosuvastatin 5 MG  tablet Commonly known as: CRESTOR Take 5 mg by mouth at bedtime.   topiramate  100 MG tablet Commonly known as: TOPAMAX  Take 1 tablet (100 mg total) by mouth daily. For mood stabilization   triamterene -hydrochlorothiazide  37.5-25 MG tablet Commonly known as: MAXZIDE -25 Take 1 tablet by mouth in the morning.        Disposition: home   Final Dx: ACDF C4-5 C5-6  Discharge Instructions      Remove dressing in 72 hours   Complete by: As directed    Call MD for:  difficulty breathing, headache or visual disturbances   Complete by: As directed    Call MD for:  persistant nausea and vomiting   Complete by: As directed    Call MD for:  redness, tenderness, or signs of infection (pain, swelling, redness, odor or green/yellow discharge around incision site)   Complete by: As directed    Call MD for:  severe uncontrolled pain   Complete by: As directed    Call MD for:  temperature >100.4   Complete by: As directed    Diet - low sodium heart healthy   Complete by: As directed    Increase activity slowly   Complete by: As directed  Signed: Alm GORMAN Molt 01/21/2024, 7:46 AM

## 2024-01-21 NOTE — Plan of Care (Signed)
 Pt doing well. Pt given D/C instructions with verbal understanding. Rx's were sent to the pharmacy by MD. Pt's incision is clean and dry with no sign of infection. Pt's IV was removed prior to D/C. Pt D/C'd home via wheelchair per MD order. Pt is stable @ D/C and has no other needs at this time. Rema Fendt, RN

## 2024-01-21 NOTE — Anesthesia Postprocedure Evaluation (Signed)
 Anesthesia Post Note  Patient: Maria Pittman  Procedure(s) Performed: ANTERIOR CERVICAL DECOMPRESSION/DISCECTOMY FUSION CERVICAL FOUR-SIX (Spine Cervical)     Patient location during evaluation: PACU Anesthesia Type: General Level of consciousness: awake and alert Pain management: pain level controlled Vital Signs Assessment: post-procedure vital signs reviewed and stable Respiratory status: spontaneous breathing, nonlabored ventilation, respiratory function stable and patient connected to nasal cannula oxygen Cardiovascular status: blood pressure returned to baseline and stable Postop Assessment: no apparent nausea or vomiting Anesthetic complications: no   No notable events documented.  Last Vitals:  Vitals:   01/21/24 0321 01/21/24 0732  BP: 137/63 (!) 109/51  Pulse: 77 85  Resp: 16 16  Temp: 36.7 C 37 C  SpO2: 100% 100%    Last Pain:  Vitals:   01/21/24 0800  TempSrc:   PainSc: 2                  Delpha Perko L Andren Bethea

## 2024-01-28 ENCOUNTER — Encounter (HOSPITAL_COMMUNITY): Payer: Self-pay | Admitting: Neurological Surgery

## 2024-04-10 IMAGING — MR MR HEAD W/O CM
12 of 13 series · 44 of 48 positions shown · non-contrast
Comparison: CT head from the same day.

CLINICAL DATA: Transient ischemic attack (TIA) Recent intermittent
aphasia symptoms over the past 5 days, tingling in extremities,
increased HA frequency and syncopal episodes

EXAM:
MRI HEAD WITHOUT CONTRAST
TECHNIQUE: Multiplanar, multiecho pulse sequences of the brain and surrounding
structures were obtained without intravenous contrast.

[Series 5: DWI · axial · 3.0mm · 0.88mm/px · z∈[-127,+12]mm · 9 of 96 slices shown (1 of 4)]
[im 1/96]
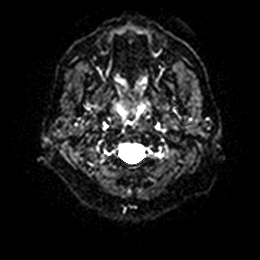
[im 12/96]
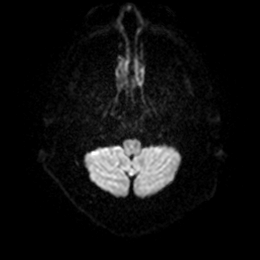
[im 24/96]
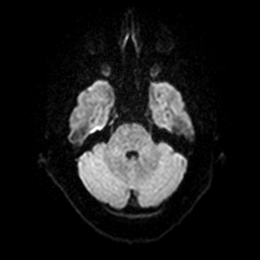
[im 36/96]
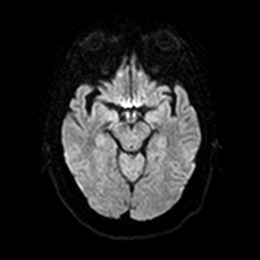
[im 48/96]
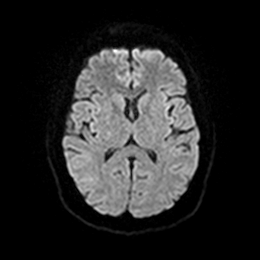
[im 60/96]
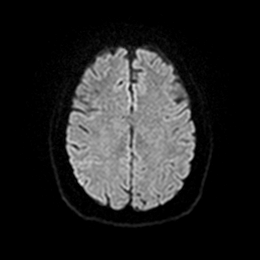
[im 72/96]
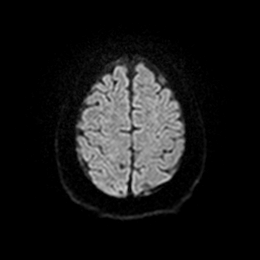
[im 84/96]
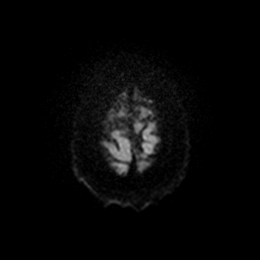
[im 96/96]
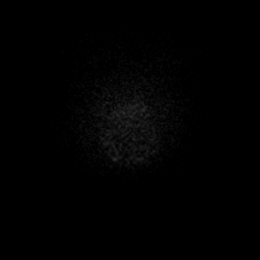

[Series 6: DWI · axial · 3.0mm · 0.88mm/px · z∈[-127,+12]mm · 4 of 48 slices shown (2 of 4)]
[im 1/48]
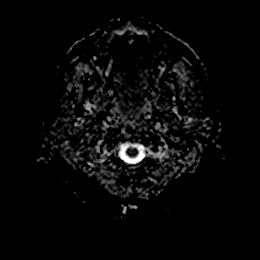
[im 16/48]
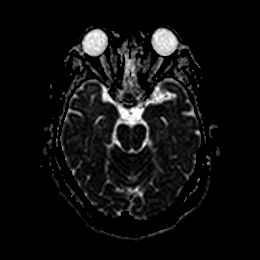
[im 32/48]
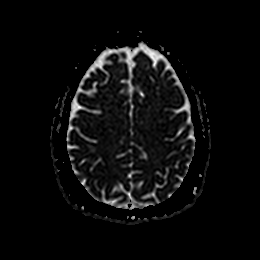
[im 48/48]
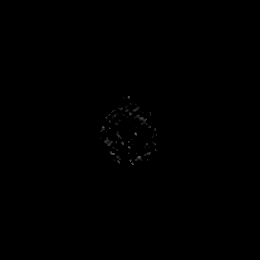

[Series 7: DWI · coronal · 4.0mm · 0.88mm/px · 5 of 64 slices shown (3 of 4)]
[im 1/64]
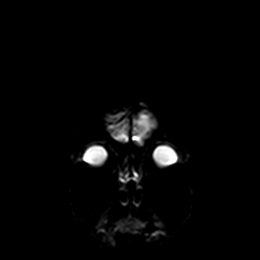
[im 16/64]
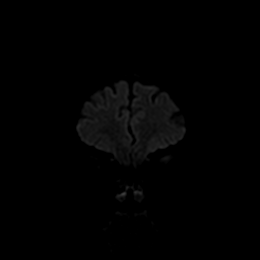
[im 32/64]
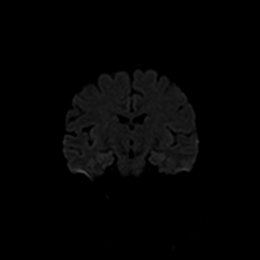
[im 48/64]
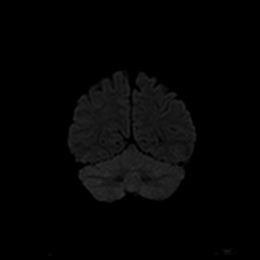
[im 64/64]
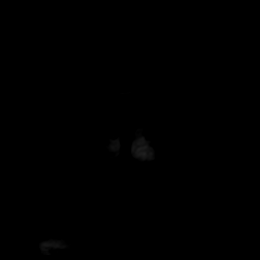

[Series 8: DWI · coronal · 4.0mm · 0.88mm/px · 3 of 32 slices shown (4 of 4)]
[im 1/32]
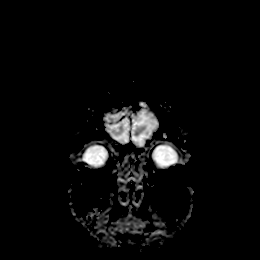
[im 16/32]
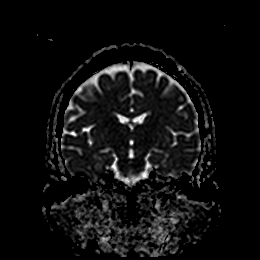
[im 32/32]
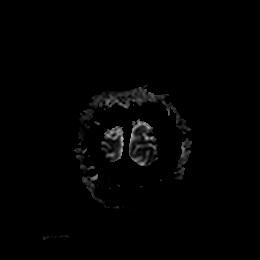

[Series 9: T1 · sagittal · 5.0mm · 0.75mm/px · 2 of 23 slices shown]
[im 1/23]
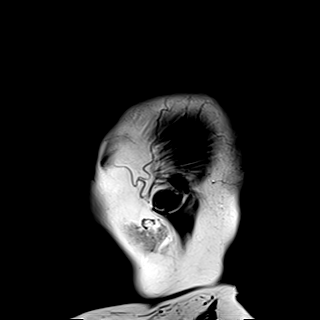
[im 23/23]
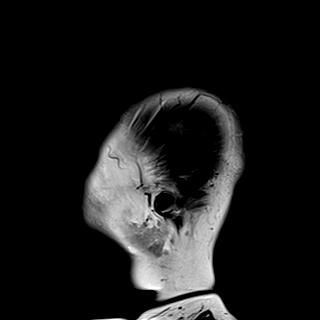

[Series 10: T2 · axial · 5.0mm · 0.72mm/px · z∈[-128,+2]mm · 2 of 23 slices shown (1 of 2)]
[im 1/23]
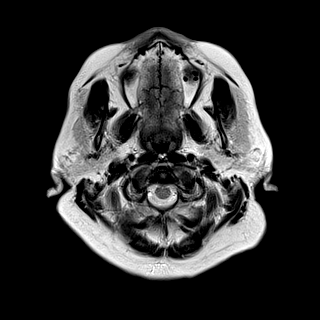
[im 23/23]
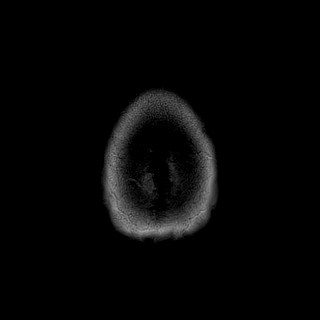

[Series 11: FLAIR · axial · 5.0mm · 0.45mm/px · z∈[-128,+2]mm · 2 of 23 slices shown]
[im 1/23]
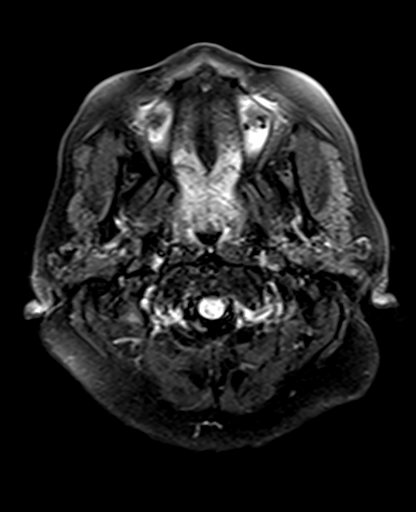
[im 23/23]
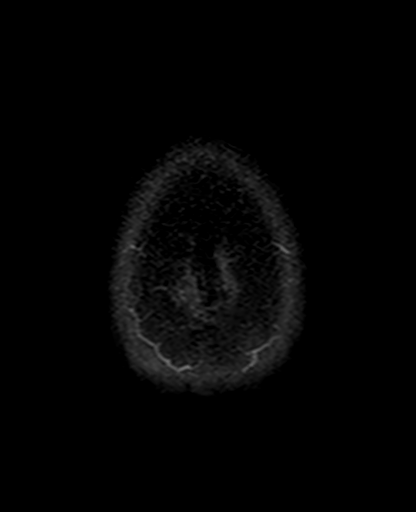

[Series 12: mag_images · axial · 3.0mm · 0.90mm/px · z∈[-127,+12]mm · 4 of 48 slices shown]
[im 1/48]
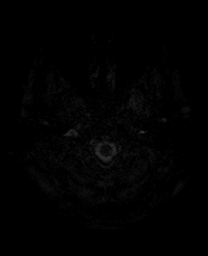
[im 16/48]
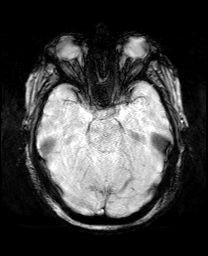
[im 32/48]
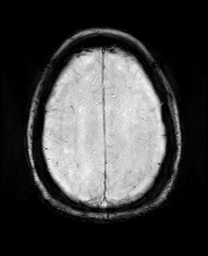
[im 48/48]
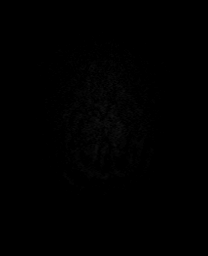

[Series 13: pha_images · axial · 3.0mm · 0.90mm/px · z∈[-127,+12]mm · 4 of 48 slices shown]
[im 1/48]
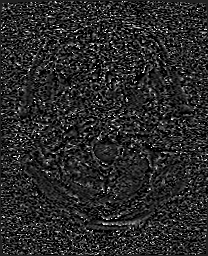
[im 16/48]
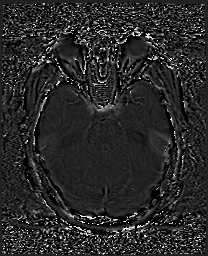
[im 32/48]
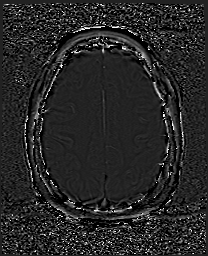
[im 48/48]
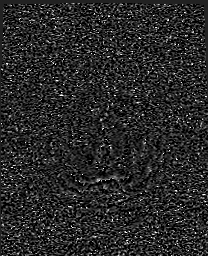

[Series 14: swi_images · axial · 3.0mm · 0.90mm/px · z∈[-127,+12]mm · 4 of 48 slices shown]
[im 1/48]
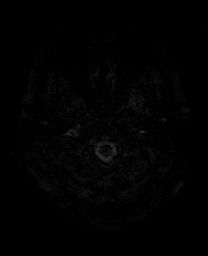
[im 16/48]
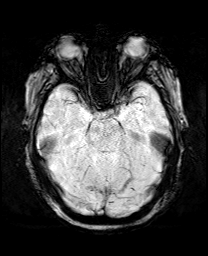
[im 32/48]
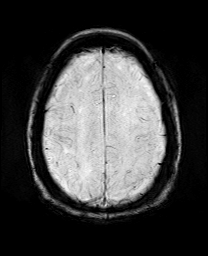
[im 48/48]
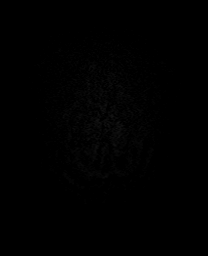

[Series 15: mip_images(sw) · axial · 24.0mm · 0.90mm/px · z∈[-117,+1]mm · 3 of 41 slices shown]
[im 1/41]
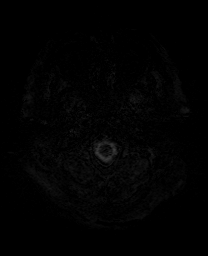
[im 21/41]
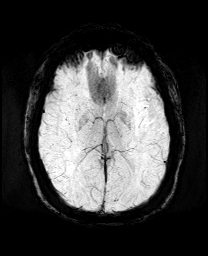
[im 41/41]
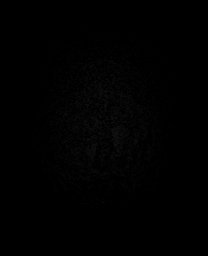

[Series 17: T2 · coronal · 5.0mm · 0.34mm/px · 2 of 27 slices shown (2 of 2)]
[im 1/27]
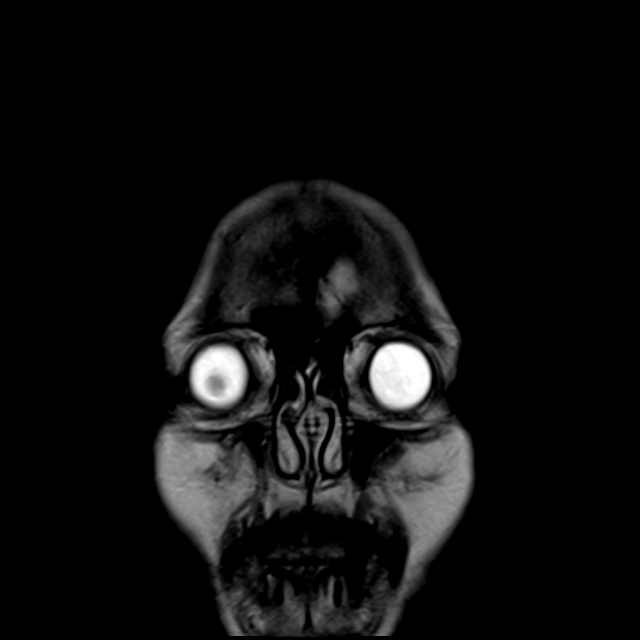
[im 27/27]
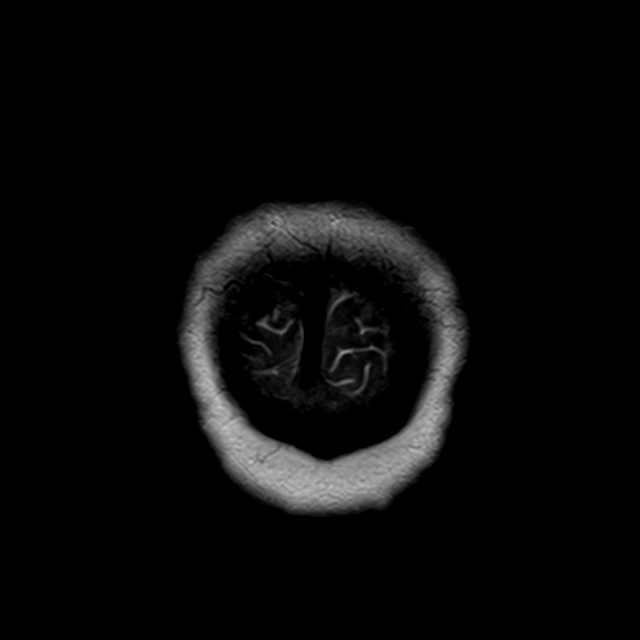

[44 of 48 positions shown; findings below may reference images not displayed]

FINDINGS: Brain: No acute infarction, hemorrhage, hydrocephalus, extra-axial
collection or mass lesion. Mild scattered T2/FLAIR hyperintensities
in the white matter, nonspecific but compatible with chronic
microvascular ischemic disease.

Vascular: Major arterial flow voids are maintained at the skull
base.

Skull and upper cervical spine: Normal marrow signal.

Sinuses/Orbits: Clear sinuses.  No acute orbital findings.

Other: No sizable mastoid effusions.
IMPRESSION: 1. No evidence of acute intracranial abnormality.
2. Mild chronic microvascular ischemic disease.

## 2024-04-10 IMAGING — CT CT HEAD W/O CM
4 of 5 series · 15 of 47 positions shown, 17 images · non-contrast
Comparison: None

CLINICAL DATA: Headache, new or worsening.  Loss of consciousness.



[Series 3: head wo · axial · 0.42mm/px · z∈[-115,-15]mm · 5 of 32 slices shown, 7 images]
[im 6/32  brain]
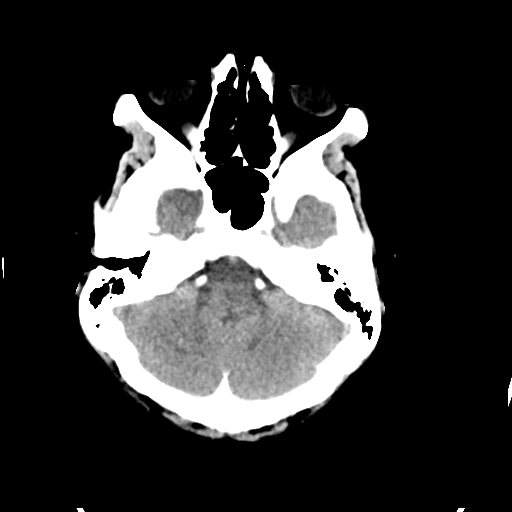
[im 6/32  bone]
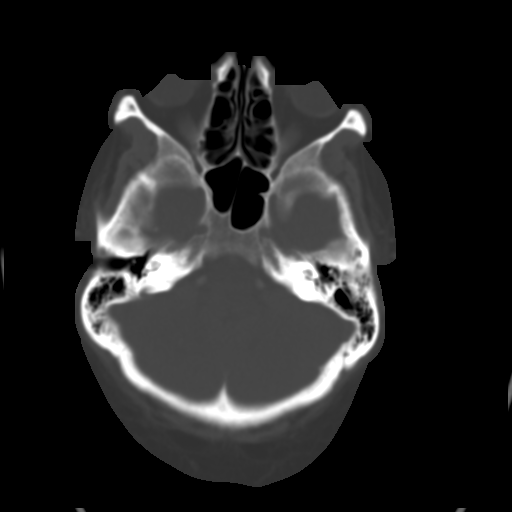
[im 11/32  brain]
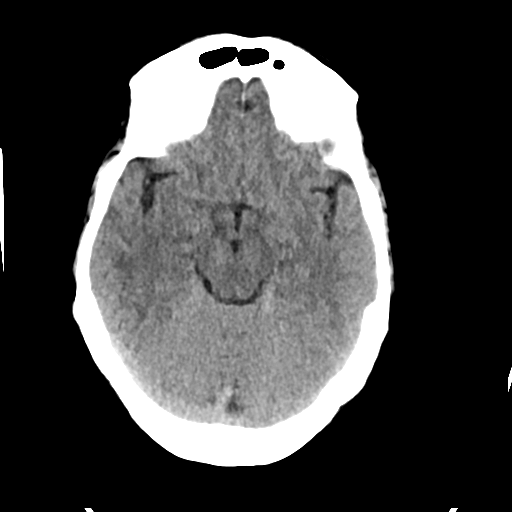
[im 16/32  brain]
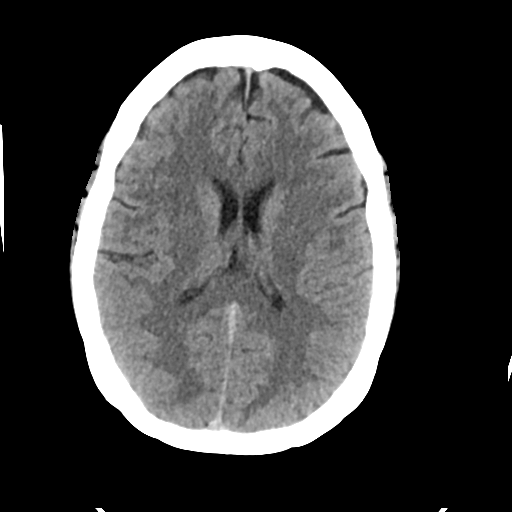
[im 21/32  brain]
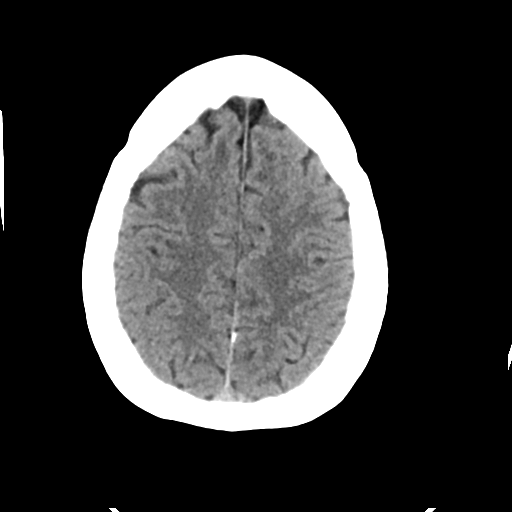
[im 26/32  brain]
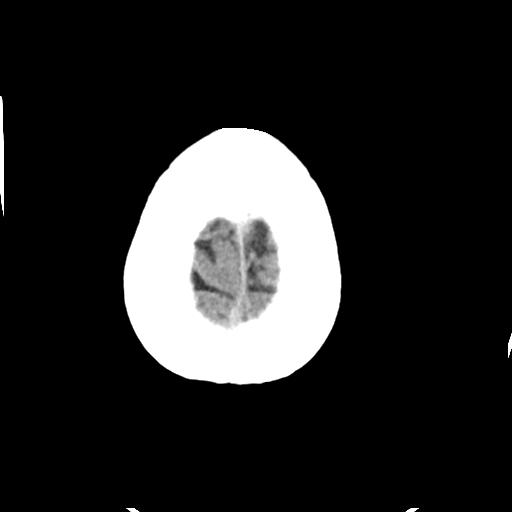
[im 26/32  bone]
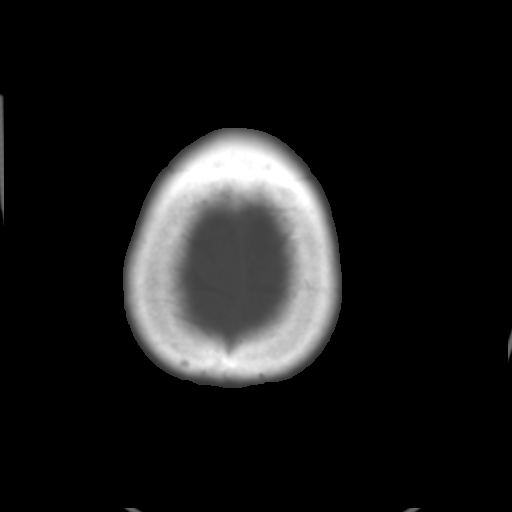

[Series 5: cor soft · coronal · 0.32mm/px · 3 of 73 slices shown]
[im 25/73  brain]
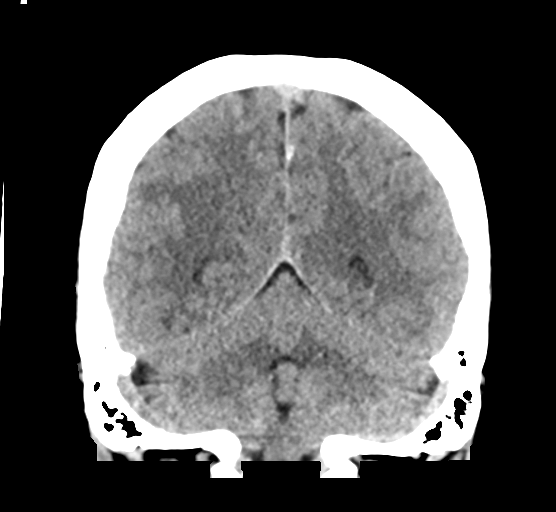
[im 33/73  brain]
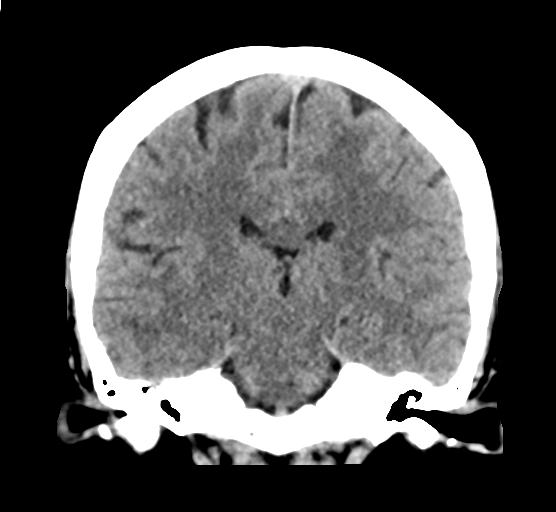
[im 41/73  brain]
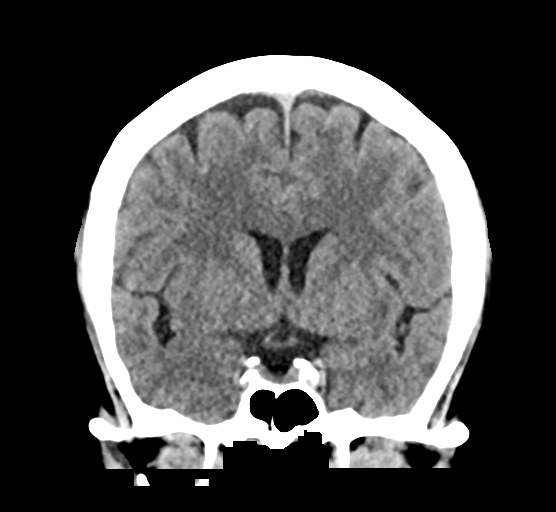

[Series 6: sag soft · sagittal · 0.29mm/px · 3 of 61 slices shown]
[im 21/61  brain]
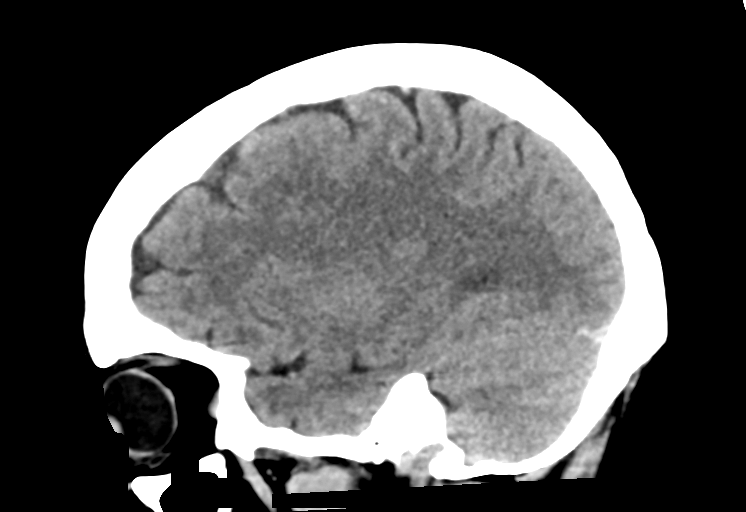
[im 31/61  brain]
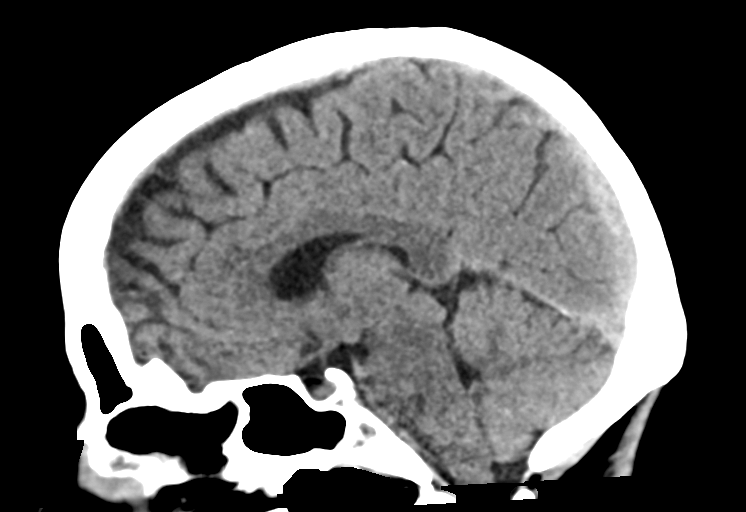
[im 41/61  brain]
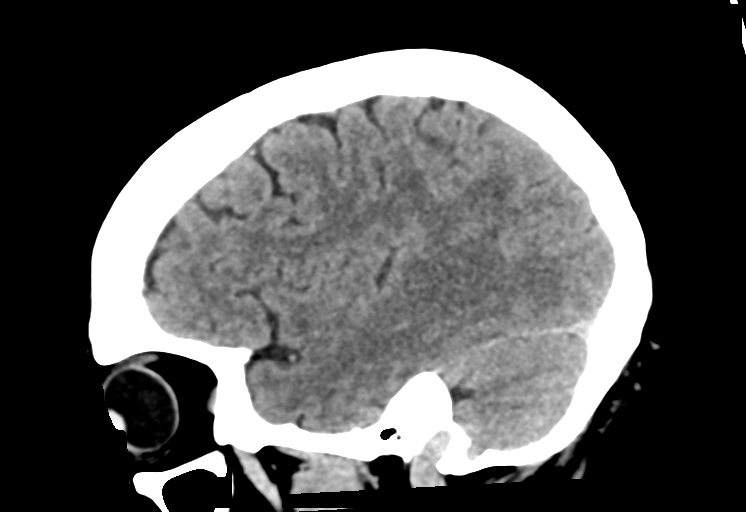

[Series 7: true axial · axial · 0.33mm/px · z∈[-105,-15]mm · 4 of 30 slices shown]
[im 6/30  brain]
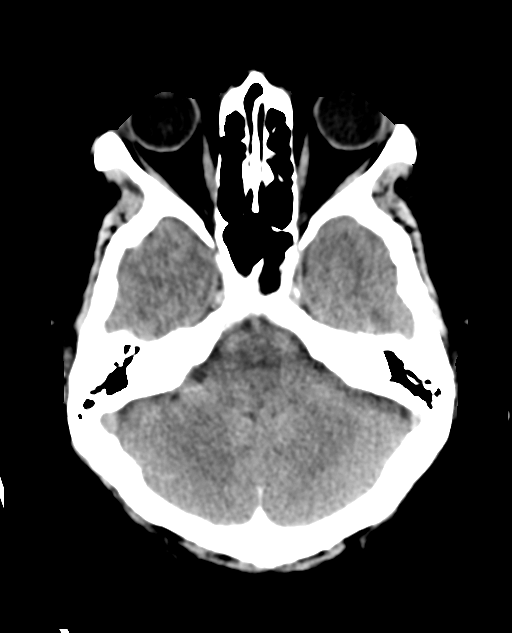
[im 12/30  brain]
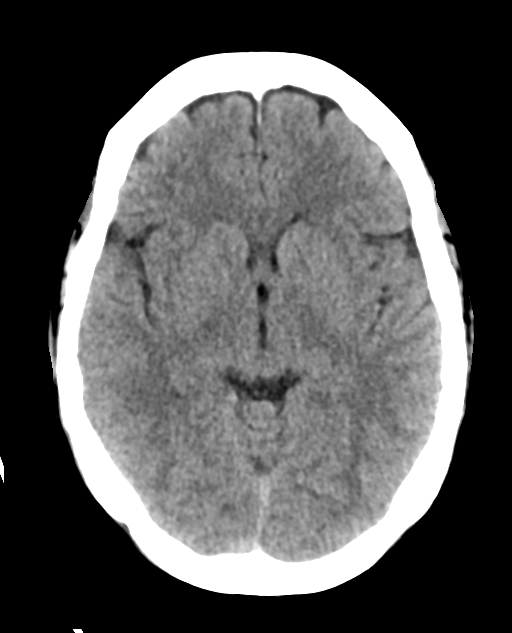
[im 18/30  brain]
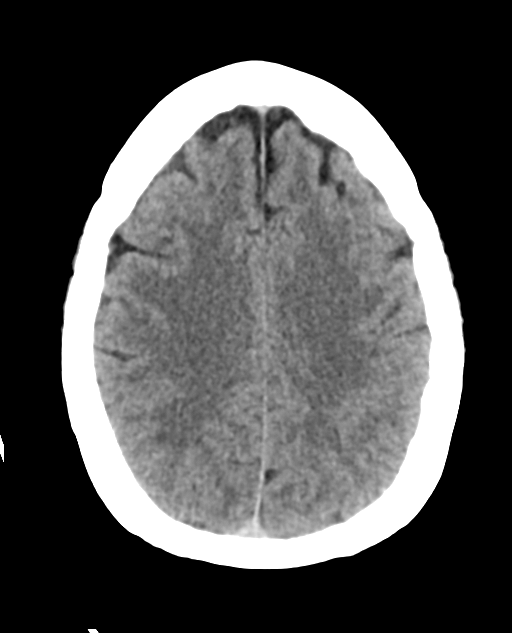
[im 24/30  brain]
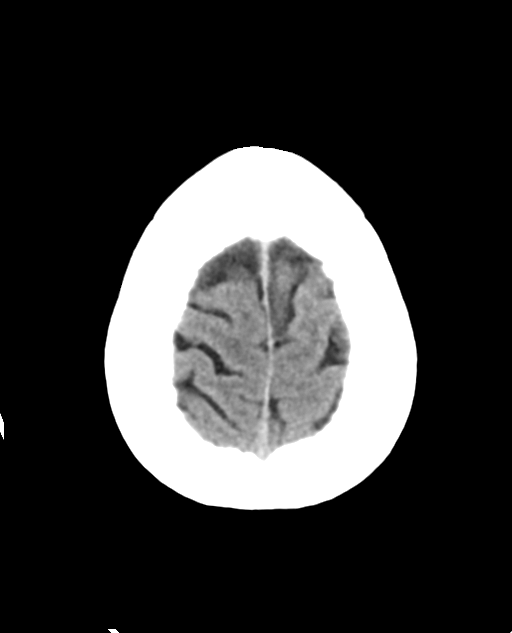

[15 of 47 positions shown; findings below may reference images not displayed]

FINDINGS: Brain: The brain shows a normal appearance without evidence of
malformation, atrophy, old or acute small or large vessel
infarction, mass lesion, hemorrhage, hydrocephalus or extra-axial
collection.

Vascular: No hyperdense vessel. No evidence of atherosclerotic
calcification.

Skull: Normal.  No traumatic finding.  No focal bone lesion.

Sinuses/Orbits: Sinuses are clear. Orbits appear normal. Mastoids
are clear.

Other: None significant
IMPRESSION: Normal head CT.
